# Patient Record
Sex: Male | Born: 1980 | Race: White | Hispanic: No
Health system: Southern US, Academic
[De-identification: ages and names within clinical notes are randomized; demographics above are authoritative.]

## PROBLEM LIST (undated history)

## (undated) ENCOUNTER — Ambulatory Visit

## (undated) ENCOUNTER — Ambulatory Visit
Payer: MEDICAID | Attending: Rehabilitative and Restorative Service Providers" | Primary: Rehabilitative and Restorative Service Providers"

## (undated) ENCOUNTER — Telehealth

## (undated) ENCOUNTER — Ambulatory Visit: Payer: MEDICAID

## (undated) ENCOUNTER — Encounter

## (undated) ENCOUNTER — Encounter: Attending: Critical Care Medicine | Primary: Critical Care Medicine

## (undated) ENCOUNTER — Encounter
Attending: Pharmacist Clinician (PhC)/ Clinical Pharmacy Specialist | Primary: Pharmacist Clinician (PhC)/ Clinical Pharmacy Specialist

## (undated) ENCOUNTER — Ambulatory Visit: Payer: PRIVATE HEALTH INSURANCE

## (undated) ENCOUNTER — Inpatient Hospital Stay

## (undated) ENCOUNTER — Ambulatory Visit: Attending: Neurological Surgery | Primary: Neurological Surgery

## (undated) ENCOUNTER — Encounter: Attending: Anesthesiology | Primary: Anesthesiology

## (undated) ENCOUNTER — Ambulatory Visit: Payer: MEDICAID | Attending: Radiation Oncology | Primary: Radiation Oncology

## (undated) ENCOUNTER — Telehealth
Attending: Student in an Organized Health Care Education/Training Program | Primary: Student in an Organized Health Care Education/Training Program

## (undated) ENCOUNTER — Encounter: Attending: Pharmacist | Primary: Pharmacist

## (undated) ENCOUNTER — Ambulatory Visit: Attending: Radiation Oncology | Primary: Radiation Oncology

## (undated) ENCOUNTER — Ambulatory Visit
Payer: MEDICAID | Attending: Student in an Organized Health Care Education/Training Program | Primary: Student in an Organized Health Care Education/Training Program

## (undated) ENCOUNTER — Telehealth: Attending: Radiation Oncology | Primary: Radiation Oncology

## (undated) ENCOUNTER — Encounter
Attending: Student in an Organized Health Care Education/Training Program | Primary: Student in an Organized Health Care Education/Training Program

## (undated) ENCOUNTER — Institutional Professional Consult (permissible substitution)
Payer: MEDICAID | Attending: Student in an Organized Health Care Education/Training Program | Primary: Student in an Organized Health Care Education/Training Program

## (undated) DIAGNOSIS — S143XXA Injury of brachial plexus, initial encounter: Secondary | ICD-10-CM

## (undated) HISTORY — PX: BRAIN SURGERY: SHX531

---

## 2011-07-23 ENCOUNTER — Ambulatory Visit: Payer: Self-pay | Admitting: Physical Medicine and Rehabilitation

## 2013-05-18 ENCOUNTER — Ambulatory Visit: Payer: Self-pay | Admitting: Gastroenterology

## 2014-06-08 DIAGNOSIS — E785 Hyperlipidemia, unspecified: Secondary | ICD-10-CM | POA: Insufficient documentation

## 2014-06-08 DIAGNOSIS — F419 Anxiety disorder, unspecified: Secondary | ICD-10-CM | POA: Insufficient documentation

## 2014-06-08 DIAGNOSIS — G8929 Other chronic pain: Secondary | ICD-10-CM | POA: Insufficient documentation

## 2014-07-26 DIAGNOSIS — M5412 Radiculopathy, cervical region: Secondary | ICD-10-CM | POA: Insufficient documentation

## 2014-07-26 DIAGNOSIS — M4802 Spinal stenosis, cervical region: Secondary | ICD-10-CM | POA: Insufficient documentation

## 2015-12-23 DIAGNOSIS — J0100 Acute maxillary sinusitis, unspecified: Secondary | ICD-10-CM | POA: Diagnosis not present

## 2017-12-30 ENCOUNTER — Encounter: Payer: Self-pay | Admitting: Neurosurgery

## 2017-12-30 ENCOUNTER — Emergency Department
Admission: EM | Admit: 2017-12-30 | Discharge: 2017-12-30 | Disposition: A | Discharge status: Home / Self Care | Payer: Self-pay | Attending: Emergency Medicine | Admitting: Emergency Medicine

## 2017-12-30 ENCOUNTER — Other Ambulatory Visit: Payer: Self-pay

## 2017-12-30 ENCOUNTER — Emergency Department: Payer: Self-pay

## 2017-12-30 DIAGNOSIS — G939 Disorder of brain, unspecified: Secondary | ICD-10-CM | POA: Insufficient documentation

## 2017-12-30 DIAGNOSIS — G9389 Other specified disorders of brain: Secondary | ICD-10-CM

## 2017-12-30 DIAGNOSIS — R569 Unspecified convulsions: Secondary | ICD-10-CM | POA: Insufficient documentation

## 2017-12-30 HISTORY — DX: Injury of brachial plexus, initial encounter: S14.3XXA

## 2017-12-30 LAB — URINE DRUG SCREEN, QUALITATIVE (ARMC ONLY)
AMPHETAMINES, UR SCREEN: NOT DETECTED
BARBITURATES, UR SCREEN: NOT DETECTED
BENZODIAZEPINE, UR SCRN: NOT DETECTED
CANNABINOID 50 NG, UR ~~LOC~~: POSITIVE — AB
Cocaine Metabolite,Ur ~~LOC~~: NOT DETECTED
MDMA (Ecstasy)Ur Screen: NOT DETECTED
Methadone Scn, Ur: NOT DETECTED
OPIATE, UR SCREEN: NOT DETECTED
PHENCYCLIDINE (PCP) UR S: NOT DETECTED
Tricyclic, Ur Screen: NOT DETECTED

## 2017-12-30 LAB — CBC WITH DIFFERENTIAL/PLATELET
Basophils Absolute: 0.1 K/uL (ref 0–0.1)
Basophils Relative: 1 %
Eosinophils Absolute: 0.1 K/uL (ref 0–0.7)
Eosinophils Relative: 1 %
HCT: 36.7 % — ABNORMAL LOW (ref 40.0–52.0)
Hemoglobin: 12.9 g/dL — ABNORMAL LOW (ref 13.0–18.0)
Lymphocytes Relative: 16 %
Lymphs Abs: 1.7 K/uL (ref 1.0–3.6)
MCH: 31.9 pg (ref 26.0–34.0)
MCHC: 35 g/dL (ref 32.0–36.0)
MCV: 91 fL (ref 80.0–100.0)
Monocytes Absolute: 0.6 K/uL (ref 0.2–1.0)
Monocytes Relative: 6 %
Neutro Abs: 8.5 K/uL — ABNORMAL HIGH (ref 1.4–6.5)
Neutrophils Relative %: 76 %
Platelets: 335 K/uL (ref 150–440)
RBC: 4.04 MIL/uL — ABNORMAL LOW (ref 4.40–5.90)
RDW: 12.7 % (ref 11.5–14.5)
WBC: 11 K/uL — ABNORMAL HIGH (ref 3.8–10.6)

## 2017-12-30 LAB — COMPREHENSIVE METABOLIC PANEL WITH GFR
ALT: 14 U/L — ABNORMAL LOW (ref 17–63)
AST: 23 U/L (ref 15–41)
Albumin: 4.7 g/dL (ref 3.5–5.0)
Alkaline Phosphatase: 52 U/L (ref 38–126)
Anion gap: 12 (ref 5–15)
BUN: 13 mg/dL (ref 6–20)
CO2: 20 mmol/L — ABNORMAL LOW (ref 22–32)
Calcium: 9.1 mg/dL (ref 8.9–10.3)
Chloride: 105 mmol/L (ref 101–111)
Creatinine, Ser: 1.03 mg/dL (ref 0.61–1.24)
GFR calc Af Amer: >60 mL/min
GFR calc non Af Amer: >60 mL/min
Glucose, Bld: 92 mg/dL (ref 65–99)
Potassium: 3.7 mmol/L (ref 3.5–5.1)
Sodium: 137 mmol/L (ref 135–145)
Total Bilirubin: 0.5 mg/dL (ref 0.3–1.2)
Total Protein: 7.7 g/dL (ref 6.5–8.1)

## 2017-12-30 MED ORDER — LEVETIRACETAM IN NACL 1000 MG/100ML IV SOLN
1000.0000 mg | Freq: Once | INTRAVENOUS | Status: AC
  Administered 2017-12-30: 1000 mg via INTRAVENOUS
  Filled 2017-12-30 (×2): qty 100

## 2017-12-30 MED ORDER — LEVETIRACETAM 500 MG PO TABS: 500.0000 mg | ORAL_TABLET | Freq: Two times a day (BID) | ORAL | 0 refills | Status: DC

## 2017-12-30 MED ORDER — SODIUM CHLORIDE 0.9 % IV BOLUS
1000.0000 mL | Freq: Once | INTRAVENOUS | Status: AC
  Administered 2017-12-30: 1000 mL via INTRAVENOUS

## 2017-12-30 MED ORDER — GADOBENATE DIMEGLUMINE 529 MG/ML IV SOLN
13.0000 mL | Freq: Once | INTRAVENOUS | Status: AC | PRN
  Administered 2017-12-30: 13 mL via INTRAVENOUS
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
## 2017-12-30 NOTE — ED Triage Notes (Signed)
Pt arrived via EMS from work after witnessed seizure.  Pt reportedly began seizing and fell to floor from chair.  Positive loss of bladder and vomiting.  EMS reports pt was postictal until placed on stretcher for transport

## 2017-12-30 NOTE — ED Provider Notes (Signed)
[facility] Emergency Department Provider Note  ___________________________________________   First MD Initiated Contact with Patient 12/30/17 1645     (approximate)  I have reviewed the triage vital signs and the nursing notes.   HISTORY  Chief Complaint Seizures   HPI Brian Parrish. is a 37 y.o. male without any chronic medical problems was presented to the emergency department today after first-time seizure.  He was at work sitting in a desk when bystanders witnessed him slumped over and then have generalized tonic-clonic activity for several minutes.  Patient urinated on himself and was postictal once EMS arrived.  Normal blood sugar for EMS.  Patient denies any complaints at this time other than being a bit shocked and surprised about the events earlier today.  He does not remember any prodrome.  Denies any recent headaches.  Says that he gets about 8 hours of sleep per night.  No excessive drinking.  No recreational drug use.  No family history of seizure disorder.   Past Medical History:  Diagnosis Date  . Brachial plexus injury, right     There are no active problems to display for this patient.     Prior to Admission medications   Not on File    Allergies Patient has no known allergies.  No family history on file.  Social History Social History   Tobacco Use  . Smoking status: Not on file  Substance Use Topics  . Alcohol use: Not on file  . Drug use: Not on file    Review of Systems  Constitutional: No fever/chills Eyes: No visual changes. ENT: No sore throat. Cardiovascular: Denies chest pain. Respiratory: Denies shortness of breath. Gastrointestinal: No abdominal pain.  No nausea, no vomiting.  No diarrhea.  No constipation. Genitourinary: Negative for dysuria. Musculoskeletal: Negative for back pain. Skin: Negative for rash. Neurological: Negative for headaches, focal weakness or  numbness.   ____________________________________________   PHYSICAL EXAM:  VITAL SIGNS: ED Triage Vitals  Enc Vitals Group     BP --      Pulse Rate 12/30/17 1639 91     Resp --      Temp 12/30/17 1639 98.4 F (36.9 C)     Temp Source 12/30/17 1639 Oral     SpO2 12/30/17 1635 100 %     Weight 12/30/17 1641 145 lb (65.8 kg)     Height 12/30/17 1641 5\' 8"  (1.727 m)     Head Circumference --      Peak Flow --      Pain Score 12/30/17 1640 0     Pain Loc --      Pain Edu? --      Excl. in [place]? --     Constitutional: Alert and oriented. Well appearing and in no acute distress. Eyes: Conjunctivae are normal.  Head: Atraumatic. Nose: No congestion/rhinnorhea. Mouth/Throat: Mucous membranes are moist.  Neck: No stridor.   Cardiovascular: Normal rate, regular rhythm. Grossly normal heart sounds.   Respiratory: Normal respiratory effort.  No retractions. Lungs CTAB. Gastrointestinal: Soft and nontender. No distention. Musculoskeletal: No lower extremity tenderness nor edema.  No joint effusions. Neurologic:  Normal speech and language. No gross focal neurologic deficits are appreciated. Skin:  Skin is warm, dry and intact. No rash noted. Psychiatric: Mood and affect are normal. Speech and behavior are normal.  ____________________________________________   LABS (all labs ordered are listed, but only abnormal results are displayed)  Labs Reviewed  CBC WITH DIFFERENTIAL/PLATELET -  Abnormal; Notable for the following components:      Result Value   WBC 11.0 (*)    RBC 4.04 (*)    Hemoglobin 12.9 (*)    HCT 36.7 (*)    Neutro Abs 8.5 (*)    All other components within normal limits  COMPREHENSIVE METABOLIC PANEL - Abnormal; Notable for the following components:   CO2 20 (*)    ALT 14 (*)    All other components within normal limits  URINE DRUG SCREEN, QUALITATIVE ([facility] ONLY) - Abnormal; Notable for the following components:   Cannabinoid 50 Ng, Ur [place] POSITIVE (*)    All  other components within normal limits  URINALYSIS, COMPLETE (UACMP) WITH MICROSCOPIC   ____________________________________________  EKG  ED ECG REPORT I, Doran Stabler, the attending physician, personally viewed and interpreted this ECG.   Date: 12/30/2017  EKG Time: 1630  Rate: 90  Rhythm: normal sinus rhythm  Axis: Normal  Intervals:none  ST&T Change: No ST segment elevation or depression.  No abnormal T wave inversion.  ____________________________________________  RADIOLOGY  Mass lesion in the right frontal region with surrounding white matter edema. ____________________________________________   PROCEDURES  Procedure(s) performed:   Procedures  Critical Care performed:   ____________________________________________   INITIAL IMPRESSION / ASSESSMENT AND PLAN / ED COURSE  Pertinent labs & imaging results that were available during my care of the patient were reviewed by me and considered in my medical decision making (see chart for details).  DDX: Epilepsy, less short abnormality, brain mass, withdrawal seizure, overdose As part of my medical decision making, I reviewed the following data within the [facility] to review outpatient records  ----------------------------------------- 5:20 PM on 12/30/2017 -----------------------------------------  Patient updated about finding of brain masses likely source of his seizure.  Discussed case Dr. Cari Caraway of neurosurgery who will be evaluating the patient.  ----------------------------------------- 8:35 PM on 12/30/2017 -----------------------------------------  Patient at this time still symptom-free.  MRI completed as requested by Dr. Cari Caraway.  Redemonstrated mass to the right frontal lobe.  Reviewed the imaging with the patient.  He is aware that he must not drive, ride a bike or go swimming or any other high risk activities that may cause injury if he seizes again.  He will be  discharged on 500 mg twice daily Keppra.  He will follow-up with Dr. Mcarthur Rossetti.  He is understanding the diagnosis as well as treatment plan willing to comply. ____________________________________________   FINAL CLINICAL IMPRESSION(S) / ED DIAGNOSES  Brain tumor.  Seizure.    NEW MEDICATIONS STARTED DURING THIS VISIT:  New Prescriptions   No medications on file     Note:  This document was prepared using Dragon voice recognition software and may include unintentional dictation errors.     Orbie Pyo, MD 12/30/17 2037

## 2018-01-17 ENCOUNTER — Ambulatory Visit: Admit: 2018-01-17 | Discharge: 2018-01-18 | Payer: MEDICAID

## 2018-01-17 DIAGNOSIS — G9389 Other specified disorders of brain: Principal | ICD-10-CM

## 2018-01-20 ENCOUNTER — Ambulatory Visit: Admit: 2018-01-20 | Discharge: 2018-01-21

## 2018-01-20 DIAGNOSIS — R900 Intracranial space-occupying lesion found on diagnostic imaging of central nervous system: Principal | ICD-10-CM

## 2018-03-30 MED ORDER — LEVETIRACETAM 500 MG TABLET
ORAL_TABLET | Freq: Two times a day (BID) | ORAL | 3 refills | 0 days | Status: CP
Start: 2018-03-30 — End: ?

## 2018-05-12 ENCOUNTER — Ambulatory Visit: Admit: 2018-05-12 | Discharge: 2018-05-12 | Payer: MEDICAID

## 2018-05-12 DIAGNOSIS — R900 Intracranial space-occupying lesion found on diagnostic imaging of central nervous system: Principal | ICD-10-CM

## 2018-05-12 DIAGNOSIS — Z01818 Encounter for other preprocedural examination: Principal | ICD-10-CM

## 2018-05-17 ENCOUNTER — Ambulatory Visit: Admit: 2018-05-17 | Discharge: 2018-05-18 | Payer: MEDICAID

## 2018-05-17 DIAGNOSIS — D499 Neoplasm of unspecified behavior of unspecified site: Principal | ICD-10-CM

## 2018-07-06 ENCOUNTER — Ambulatory Visit: Admit: 2018-07-06 | Discharge: 2018-07-06 | Payer: MEDICAID

## 2018-07-06 ENCOUNTER — Encounter: Admit: 2018-07-06 | Discharge: 2018-07-06 | Payer: MEDICAID

## 2018-07-06 DIAGNOSIS — C719 Malignant neoplasm of brain, unspecified: Principal | ICD-10-CM

## 2018-07-06 DIAGNOSIS — G939 Disorder of brain, unspecified: Principal | ICD-10-CM

## 2018-07-08 ENCOUNTER — Ambulatory Visit: Admit: 2018-07-08 | Discharge: 2018-07-10 | Disposition: A | Discharge status: Home / Self Care | Payer: MEDICAID

## 2018-07-08 ENCOUNTER — Encounter
Admit: 2018-07-08 | Discharge: 2018-07-10 | Disposition: A | Discharge status: Home / Self Care | Payer: MEDICAID | Attending: Certified Registered"

## 2018-07-08 DIAGNOSIS — G939 Disorder of brain, unspecified: Principal | ICD-10-CM

## 2018-07-09 DIAGNOSIS — G939 Disorder of brain, unspecified: Principal | ICD-10-CM

## 2018-07-10 MED ORDER — DEXAMETHASONE 1 MG TABLET
ORAL_TABLET | 0 refills | 0 days | Status: CP
Start: 2018-07-10 — End: ?

## 2018-07-10 MED ORDER — LEVETIRACETAM 1,000 MG TABLET
ORAL_TABLET | Freq: Two times a day (BID) | ORAL | 0 refills | 0 days | Status: CP
Start: 2018-07-10 — End: 2018-07-15

## 2018-07-10 MED ORDER — FAMOTIDINE 20 MG TABLET
ORAL_TABLET | Freq: Two times a day (BID) | ORAL | 0 refills | 0 days | Status: CP
Start: 2018-07-10 — End: 2018-08-09

## 2018-07-10 MED ORDER — OXYCODONE 10 MG TABLET
ORAL_TABLET | ORAL | 0 refills | 0 days | Status: CP | PRN
Start: 2018-07-10 — End: 2018-07-17

## 2018-07-22 ENCOUNTER — Ambulatory Visit: Admit: 2018-07-22 | Discharge: 2018-07-23 | Payer: MEDICAID

## 2018-07-22 DIAGNOSIS — Z4802 Encounter for removal of sutures: Principal | ICD-10-CM

## 2018-08-16 ENCOUNTER — Ambulatory Visit: Admit: 2018-08-16 | Discharge: 2018-08-16 | Attending: Radiation Oncology | Primary: Radiation Oncology

## 2018-08-16 ENCOUNTER — Ambulatory Visit
Admit: 2018-08-16 | Discharge: 2018-08-16 | Attending: Student in an Organized Health Care Education/Training Program | Primary: Student in an Organized Health Care Education/Training Program

## 2018-08-16 ENCOUNTER — Other Ambulatory Visit: Admit: 2018-08-16 | Discharge: 2018-08-16

## 2018-08-16 DIAGNOSIS — D496 Neoplasm of unspecified behavior of brain: Principal | ICD-10-CM

## 2018-08-16 DIAGNOSIS — G40909 Epilepsy, unspecified, not intractable, without status epilepticus: Secondary | ICD-10-CM

## 2018-08-16 DIAGNOSIS — C719 Malignant neoplasm of brain, unspecified: Secondary | ICD-10-CM

## 2018-08-16 DIAGNOSIS — F419 Anxiety disorder, unspecified: Secondary | ICD-10-CM

## 2018-08-17 DIAGNOSIS — G40909 Epilepsy, unspecified, not intractable, without status epilepticus: Secondary | ICD-10-CM | POA: Insufficient documentation

## 2018-10-06 ENCOUNTER — Ambulatory Visit: Admit: 2018-10-06 | Discharge: 2018-10-06

## 2018-10-06 DIAGNOSIS — C719 Malignant neoplasm of brain, unspecified: Principal | ICD-10-CM

## 2018-10-06 DIAGNOSIS — D496 Neoplasm of unspecified behavior of brain: Principal | ICD-10-CM

## 2018-10-24 DIAGNOSIS — R569 Unspecified convulsions: Principal | ICD-10-CM

## 2018-10-26 ENCOUNTER — Emergency Department: Payer: Self-pay

## 2018-10-26 ENCOUNTER — Emergency Department
Admission: EM | Admit: 2018-10-26 | Discharge: 2018-10-27 | Disposition: A | Discharge status: Home / Self Care | Payer: Self-pay | Attending: Emergency Medicine | Admitting: Emergency Medicine

## 2018-10-26 ENCOUNTER — Encounter: Payer: Self-pay | Admitting: Emergency Medicine

## 2018-10-26 ENCOUNTER — Other Ambulatory Visit: Payer: Self-pay

## 2018-10-26 DIAGNOSIS — F17290 Nicotine dependence, other tobacco product, uncomplicated: Secondary | ICD-10-CM | POA: Insufficient documentation

## 2018-10-26 DIAGNOSIS — R531 Weakness: Secondary | ICD-10-CM | POA: Insufficient documentation

## 2018-10-26 DIAGNOSIS — M545 Low back pain: Secondary | ICD-10-CM | POA: Insufficient documentation

## 2018-10-26 DIAGNOSIS — Z79899 Other long term (current) drug therapy: Secondary | ICD-10-CM | POA: Insufficient documentation

## 2018-10-26 DIAGNOSIS — M549 Dorsalgia, unspecified: Secondary | ICD-10-CM

## 2018-10-26 DIAGNOSIS — Z85841 Personal history of malignant neoplasm of brain: Secondary | ICD-10-CM | POA: Insufficient documentation

## 2018-10-26 DIAGNOSIS — M5416 Radiculopathy, lumbar region: Secondary | ICD-10-CM

## 2018-10-26 DIAGNOSIS — R202 Paresthesia of skin: Secondary | ICD-10-CM | POA: Insufficient documentation

## 2018-10-26 DIAGNOSIS — G8929 Other chronic pain: Secondary | ICD-10-CM | POA: Insufficient documentation

## 2018-10-26 LAB — BASIC METABOLIC PANEL
Anion gap: 9 (ref 5–15)
BUN: 9 mg/dL (ref 6–20)
CHLORIDE: 105 mmol/L (ref 98–111)
CO2: 26 mmol/L (ref 22–32)
Calcium: 9.3 mg/dL (ref 8.9–10.3)
Creatinine, Ser: 0.93 mg/dL (ref 0.61–1.24)
GFR calc non Af Amer: >60 mL/min (ref >60)
Glucose, Bld: 122 mg/dL — ABNORMAL HIGH (ref 70–99)
POTASSIUM: 3.5 mmol/L (ref 3.5–5.1)
SODIUM: 140 mmol/L (ref 135–145)

## 2018-10-26 LAB — CBC WITH DIFFERENTIAL/PLATELET
ABS IMMATURE GRANULOCYTES: 0.03 10*3/uL (ref 0.00–0.07)
Basophils Absolute: 0.1 10*3/uL (ref 0.0–0.1)
Basophils Relative: 1 %
EOS PCT: 3 %
Eosinophils Absolute: 0.3 10*3/uL (ref 0.0–0.5)
HEMATOCRIT: 38.7 % — AB (ref 39.0–52.0)
HEMOGLOBIN: 13.2 g/dL (ref 13.0–17.0)
Immature Granulocytes: 0 %
Lymphocytes Relative: 20 %
Lymphs Abs: 2 10*3/uL (ref 0.7–4.0)
MCH: 30.6 pg (ref 26.0–34.0)
MCHC: 34.1 g/dL (ref 30.0–36.0)
MCV: 89.8 fL (ref 80.0–100.0)
MONO ABS: 0.6 10*3/uL (ref 0.1–1.0)
MONOS PCT: 6 %
Neutro Abs: 7 10*3/uL (ref 1.7–7.7)
Neutrophils Relative %: 70 %
Platelets: 341 10*3/uL (ref 150–400)
RBC: 4.31 MIL/uL (ref 4.22–5.81)
RDW: 12.2 % (ref 11.5–15.5)
WBC: 10 10*3/uL (ref 4.0–10.5)
nRBC: 0 % (ref 0.0–0.2)

## 2018-10-26 MED ORDER — ONDANSETRON HCL 4 MG/2ML IJ SOLN
4.0000 mg | Freq: Once | INTRAMUSCULAR | Status: AC
  Administered 2018-10-26: 4 mg via INTRAVENOUS
  Filled 2018-10-26: qty 2

## 2018-10-26 MED ORDER — MORPHINE SULFATE (PF) 4 MG/ML IV SOLN
4.0000 mg | Freq: Once | INTRAVENOUS | Status: AC
Start: 2018-10-26 — End: 2018-10-26
  Administered 2018-10-26: 4 mg via INTRAVENOUS
  Filled 2018-10-26: qty 1

## 2018-10-26 NOTE — ED Provider Notes (Signed)
[facility] Emergency Department Provider Note  Time seen: 11:11 PM  I have reviewed the triage vital signs and the nursing notes.   HISTORY  Chief Complaint Back Pain and Weakness    HPI Brian Parrish. is a 38 y.o. 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  According to the patient for the past 10 days he has been experiencing progressively worsening lower back pain, worse with movement or palpation.  States he is also been experiencing numbness in both of his legs as well as a tingling sensation in both of his upper extremities.  Patient had a grade 2 astrocytoma resected in November 2019, has been following up with [facility] oncology since the resection, plan to start radiation in the future.  Patient states he last had an MRI of the brain in February which showed no significant progression of the remaining part of the astrocytoma.  Patient states he has never had any back pain numbness or tingling in the past.  Denies any fever.   Past Medical History:  Diagnosis Date  . Brachial plexus injury, right     There are no active problems to display for this patient.   History reviewed. No pertinent surgical history.  Prior to Admission medications   Medication Sig Start Date End Date Taking? Authorizing Provider  levETIRAcetam (KEPPRA) 500 MG tablet Take 1 tablet (500 mg total) by mouth 2 (two) times daily. 12/30/17   Schaevitz, Randall An, MD    No Known Allergies  History reviewed. No pertinent family history.  Social History Social History   Tobacco Use  . Smoking status: Current Every Day Smoker    Types: Cigars  . Smokeless tobacco: Never Used  Substance Use Topics  . Alcohol use: Not on file  . Drug use: Not on file    Review of Systems Constitutional: Negative for  fever. Cardiovascular: Negative for chest pain. Respiratory: Negative for shortness of breath. Gastrointestinal: Negative for abdominal pain Genitourinary: Negative for urinary compaints Musculoskeletal: Negative for musculoskeletal complaints Skin: Negative for skin complaints  Neurological: Negative for headache All other ROS negative  ____________________________________________   PHYSICAL EXAM:  VITAL SIGNS: ED Triage Vitals  Enc Vitals Group     BP 10/26/18 1737 121/82     Pulse Rate 10/26/18 1737 100     Resp 10/26/18 1737 16     Temp 10/26/18 1737 98.7 F (37.1 C)     Temp Source 10/26/18 1737 Oral     SpO2 10/26/18 1737 100 %     Weight 10/26/18 1752 150 lb (68 kg)     Height 10/26/18 1752 5\' 8"  (1.727 m)     Head Circumference --      Peak Flow --      Pain Score 10/26/18 1738 10     Pain Loc --      Pain Edu? --      Excl. in [place]? --    Constitutional: Alert and oriented. Well appearing and in no distress. Eyes: Normal exam ENT   Head: Normocephalic and atraumatic.   Mouth/Throat: Mucous membranes are moist. Cardiovascular: Normal rate, regular rhythm.  Respiratory: Normal respiratory effort without tachypnea nor retractions. Breath sounds are clear  Gastrointestinal: Soft and nontender. No distention.  Musculoskeletal: Nontender with normal range of motion in all extremities. Neurologic:  Normal speech and  language.  Patient has good strength in all extremities however he describes decreased sensation right greater than left in his lower extremities as well as a tingling sensation in the upper extremities.  Renal nerves intact. Skin:  Skin is warm, dry and intact.  Psychiatric: Mood and affect are normal.   ____________________________________________   RADIOLOGY  MRI is pending  ____________________________________________   INITIAL IMPRESSION / ASSESSMENT AND PLAN / ED COURSE  Pertinent labs & imaging results that were available during my  care of the patient were reviewed by me and considered in my medical decision making (see chart for details).  Patient presents to the emergency department for numbness and tingling of the upper and lower extremities along with lower back pain worse with movement.  Differential would include radicular pain, sciatica, lower back pain or lumbar strain.  Differential however would also include metastatic spread of tumor, or recurrence of tumor.  Given the patient's neurological findings with history of grade 2 astrocytoma we will proceed with MR imaging of the brain, C-spine, T-spine and L-spine.  Patient agreeable to plan of care.  Patient care signed out to Dr. Owens Shark.  ____________________________________________   FINAL CLINICAL IMPRESSION(S) / ED DIAGNOSES  Back pain Paresthesias   Harvest Dark, MD 10/26/18 2315

## 2018-10-26 NOTE — ED Triage Notes (Signed)
Pt presents to ED c/o low back pain x10 days radiating down both legs with worsening bil lower extremity weakness and numbness. Hx brain surgery for cancer in November 2019. No incontinence.

## 2018-10-26 NOTE — ED Notes (Signed)
Pt stated he could not urinate at this time. Will attempt to collect specimen at a later time

## 2018-10-26 NOTE — ED Notes (Addendum)
Spoke to [employer] regarding pt presentation. Orders received for basic labs, UA, and lumbar spine XR. First nurse notified pt needs protocols done.

## 2018-10-27 ENCOUNTER — Emergency Department: Payer: Self-pay

## 2018-10-27 MED ORDER — HYDROMORPHONE HCL 1 MG/ML IJ SOLN
INTRAMUSCULAR | Status: AC
  Administered 2018-10-27: 1 mg via INTRAVENOUS
  Filled 2018-10-27: qty 1

## 2018-10-27 MED ORDER — GADOBUTROL 1 MMOL/ML IV SOLN
6.0000 mL | Freq: Once | INTRAVENOUS | Status: AC | PRN
  Administered 2018-10-27: 6 mL via INTRAVENOUS

## 2018-10-27 MED ORDER — LIDOCAINE 5 % EX PTCH: 1.0000 | MEDICATED_PATCH | CUTANEOUS | 0 refills | Status: DC

## 2018-10-27 MED ORDER — PREDNISONE 20 MG PO TABS
60.0000 mg | ORAL_TABLET | Freq: Once | ORAL | Status: AC
  Administered 2018-10-27: 60 mg via ORAL
  Filled 2018-10-27: qty 3

## 2018-10-27 MED ORDER — HYDROMORPHONE HCL 1 MG/ML IJ SOLN
1.0000 mg | Freq: Once | INTRAMUSCULAR | Status: AC
  Administered 2018-10-27: 1 mg via INTRAVENOUS

## 2018-10-27 MED ORDER — CYCLOBENZAPRINE HCL 10 MG PO TABS
10.0000 mg | ORAL_TABLET | Freq: Once | ORAL | Status: AC
  Administered 2018-10-27: 10 mg via ORAL
  Filled 2018-10-27: qty 1

## 2018-10-27 MED ORDER — PREDNISONE 20 MG PO TABS: 60.0000 mg | ORAL_TABLET | Freq: Every day | ORAL | 0 refills | Status: AC

## 2018-10-27 MED ORDER — LIDOCAINE 5 % EX PTCH
1.0000 | MEDICATED_PATCH | CUTANEOUS | Status: DC
  Administered 2018-10-27: 1 via TRANSDERMAL
  Filled 2018-10-27: qty 1

## 2018-10-27 MED ORDER — CYCLOBENZAPRINE HCL 10 MG PO TABS: 10.0000 mg | ORAL_TABLET | Freq: Three times a day (TID) | ORAL | 0 refills | Status: DC | PRN

## 2018-10-27 NOTE — ED Notes (Signed)
MD at bedside. 

## 2018-10-27 NOTE — ED Notes (Signed)
Patient transported to MRI 

## 2019-05-24 ENCOUNTER — Telehealth: Admit: 2019-05-24 | Discharge: 2019-05-25 | Payer: MEDICAID | Attending: Neurology | Primary: Neurology

## 2019-05-24 DIAGNOSIS — C719 Malignant neoplasm of brain, unspecified: Secondary | ICD-10-CM

## 2019-05-24 DIAGNOSIS — R569 Unspecified convulsions: Secondary | ICD-10-CM

## 2019-05-24 MED ORDER — LEVETIRACETAM 500 MG TABLET
ORAL_TABLET | Freq: Two times a day (BID) | ORAL | 3 refills | 90.00000 days | Status: CP
Start: 2019-05-24 — End: ?

## 2019-07-10 ENCOUNTER — Institutional Professional Consult (permissible substitution)
Admit: 2019-07-10 | Discharge: 2019-07-11 | Payer: MEDICAID | Attending: Student in an Organized Health Care Education/Training Program | Primary: Student in an Organized Health Care Education/Training Program

## 2019-07-10 DIAGNOSIS — C719 Malignant neoplasm of brain, unspecified: Principal | ICD-10-CM

## 2021-09-17 ENCOUNTER — Emergency Department: Payer: No Typology Code available for payment source

## 2021-09-17 ENCOUNTER — Encounter: Payer: Self-pay | Admitting: Emergency Medicine

## 2021-09-17 ENCOUNTER — Emergency Department
Admission: EM | Admit: 2021-09-17 | Discharge: 2021-09-17 | Disposition: A | Discharge status: Other Acute Inpatient Hospital | Payer: No Typology Code available for payment source | Attending: Emergency Medicine | Admitting: Emergency Medicine

## 2021-09-17 ENCOUNTER — Other Ambulatory Visit: Payer: Self-pay

## 2021-09-17 ENCOUNTER — Encounter
Admit: 2021-09-17 | Discharge: 2021-09-19 | Disposition: A | Discharge status: Home / Self Care | Payer: MEDICAID | Source: Other Acute Inpatient Hospital

## 2021-09-17 ENCOUNTER — Ambulatory Visit
Admit: 2021-09-17 | Discharge: 2021-09-19 | Disposition: A | Discharge status: Home / Self Care | Payer: MEDICAID | Source: Other Acute Inpatient Hospital

## 2021-09-17 ENCOUNTER — Encounter
Admit: 2021-09-17 | Discharge: 2021-09-19 | Disposition: A | Discharge status: Home / Self Care | Payer: MEDICAID | Source: Other Acute Inpatient Hospital | Attending: Student in an Organized Health Care Education/Training Program

## 2021-09-17 ENCOUNTER — Ambulatory Visit: Admit: 2021-09-17 | Payer: MEDICAID

## 2021-09-17 ENCOUNTER — Ambulatory Visit
Admit: 2021-09-17 | Discharge: 2021-10-08 | Payer: MEDICAID | Attending: Radiation Oncology | Primary: Radiation Oncology

## 2021-09-17 ENCOUNTER — Ambulatory Visit: Admit: 2021-09-17 | Payer: MEDICAID | Attending: Radiation Oncology | Primary: Radiation Oncology

## 2021-09-17 DIAGNOSIS — Z8616 Personal history of COVID-19: Secondary | ICD-10-CM | POA: Insufficient documentation

## 2021-09-17 DIAGNOSIS — G936 Cerebral edema: Secondary | ICD-10-CM

## 2021-09-17 DIAGNOSIS — Z20822 Contact with and (suspected) exposure to covid-19: Secondary | ICD-10-CM | POA: Insufficient documentation

## 2021-09-17 DIAGNOSIS — C719 Malignant neoplasm of brain, unspecified: Secondary | ICD-10-CM | POA: Diagnosis not present

## 2021-09-17 DIAGNOSIS — Z041 Encounter for examination and observation following transport accident: Secondary | ICD-10-CM | POA: Insufficient documentation

## 2021-09-17 DIAGNOSIS — D496 Neoplasm of unspecified behavior of brain: Secondary | ICD-10-CM

## 2021-09-17 DIAGNOSIS — R410 Disorientation, unspecified: Secondary | ICD-10-CM | POA: Diagnosis present

## 2021-09-17 LAB — BASIC METABOLIC PANEL
Anion gap: 8 (ref 5–15)
BUN: 16 mg/dL (ref 6–20)
CO2: 26 mmol/L (ref 22–32)
Calcium: 9.1 mg/dL (ref 8.9–10.3)
Chloride: 104 mmol/L (ref 98–111)
Creatinine, Ser: 0.93 mg/dL (ref 0.61–1.24)
GFR, Estimated: >60 mL/min (ref >60)
Glucose, Bld: 106 mg/dL — ABNORMAL HIGH (ref 70–99)
Potassium: 3.2 mmol/L — ABNORMAL LOW (ref 3.5–5.1)
Sodium: 138 mmol/L (ref 135–145)

## 2021-09-17 LAB — CBC WITH DIFFERENTIAL/PLATELET
Abs Immature Granulocytes: 0.05 10*3/uL (ref 0.00–0.07)
Basophils Absolute: 0.1 10*3/uL (ref 0.0–0.1)
Basophils Relative: 1 %
Eosinophils Absolute: 0.1 10*3/uL (ref 0.0–0.5)
Eosinophils Relative: 1 %
HCT: 43 % (ref 39.0–52.0)
Hemoglobin: 14.3 g/dL (ref 13.0–17.0)
Immature Granulocytes: 0 %
Lymphocytes Relative: 21 %
Lymphs Abs: 2.7 10*3/uL (ref 0.7–4.0)
MCH: 30.5 pg (ref 26.0–34.0)
MCHC: 33.3 g/dL (ref 30.0–36.0)
MCV: 91.7 fL (ref 80.0–100.0)
Monocytes Absolute: 1.1 10*3/uL — ABNORMAL HIGH (ref 0.1–1.0)
Monocytes Relative: 8 %
Neutro Abs: 8.8 10*3/uL — ABNORMAL HIGH (ref 1.7–7.7)
Neutrophils Relative %: 69 %
Platelets: 335 10*3/uL (ref 150–400)
RBC: 4.69 MIL/uL (ref 4.22–5.81)
RDW: 12.2 % (ref 11.5–15.5)
WBC: 12.7 10*3/uL — ABNORMAL HIGH (ref 4.0–10.5)
nRBC: 0 % (ref 0.0–0.2)

## 2021-09-17 LAB — RESP PANEL BY RT-PCR (FLU A&B, COVID) ARPGX2
Influenza A by PCR: NEGATIVE
Influenza B by PCR: NEGATIVE
SARS Coronavirus 2 by RT PCR: NEGATIVE

## 2021-09-17 LAB — ETHANOL: Alcohol, Ethyl (B): <10 mg/dL (ref <10)

## 2021-09-17 LAB — LACTIC ACID, PLASMA: Lactic Acid, Venous: 0.9 mmol/L (ref 0.5–1.9)

## 2021-09-17 NOTE — ED Notes (Signed)
Pt transported by [facility] to [facility]
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
## 2021-09-19 DIAGNOSIS — C719 Malignant neoplasm of brain, unspecified: Principal | ICD-10-CM

## 2021-09-19 DIAGNOSIS — D496 Neoplasm of unspecified behavior of brain: Principal | ICD-10-CM

## 2021-09-19 MED ORDER — BLOOD-GLUCOSE METER KIT WRAPPER
0 refills | 0 days | Status: CP
Start: 2021-09-19 — End: 2021-09-19
  Filled 2021-09-19: qty 100, 25d supply, fill #0

## 2021-09-19 MED ORDER — LANCETS 30 GAUGE
0 refills | 0 days | Status: CP
Start: 2021-09-19 — End: ?

## 2021-09-19 MED ORDER — BLOOD-GLUCOSE METER
0 refills | 0 days | Status: CP
Start: 2021-09-19 — End: ?

## 2021-09-19 MED ORDER — LANCING DEVICE
0 refills | 0.00000 days | Status: CP
Start: 2021-09-19 — End: 2021-09-19

## 2021-09-19 MED ORDER — INSULIN ASPART (U-100) 100 UNIT/ML (3 ML) SUBCUTANEOUS PEN
0 refills | 0 days | Status: CP
Start: 2021-09-19 — End: ?
  Filled 2021-09-19: qty 15, 75d supply, fill #0

## 2021-09-19 MED ORDER — BLOOD GLUCOSE TEST STRIPS
ORAL_STRIP | 0 refills | 0 days | Status: CP
Start: 2021-09-19 — End: 2021-09-19

## 2021-09-19 MED ORDER — PEN NEEDLE, DIABETIC 32 GAUGE X 5/32" (4 MM)
0 refills | 0.00000 days | Status: CP
Start: 2021-09-19 — End: 2021-09-19

## 2021-09-19 MED ORDER — OXYCODONE 5 MG TABLET
ORAL_TABLET | ORAL | 0 refills | 4.00000 days | Status: CP | PRN
Start: 2021-09-19 — End: 2021-09-19
  Filled 2021-09-19: qty 20, 3d supply, fill #0

## 2021-09-19 MED ORDER — INSULIN LISPRO (U-100) 100 UNIT/ML SUBCUTANEOUS PEN
0 refills | 0.00000 days | Status: CP
Start: 2021-09-19 — End: 2021-09-19

## 2021-09-19 MED ORDER — LANCETS
0 refills | 0 days | Status: CP
Start: 2021-09-19 — End: 2021-09-19
  Filled 2021-09-19: qty 100, 20d supply, fill #0

## 2021-09-19 MED ORDER — DEXAMETHASONE 1 MG TABLET
ORAL_TABLET | ORAL | 0 refills | 42.00000 days | Status: CP
Start: 2021-09-19 — End: 2021-10-31
  Filled 2021-09-19: qty 272, 14d supply, fill #0

## 2021-09-19 MED ORDER — FAMOTIDINE 20 MG TABLET
ORAL_TABLET | Freq: Two times a day (BID) | ORAL | 0 refills | 30.00000 days | Status: CP
Start: 2021-09-19 — End: 2021-10-19
  Filled 2021-09-19: qty 28, 14d supply, fill #0

## 2021-09-19 MED ORDER — LEVETIRACETAM 750 MG TABLET
ORAL_TABLET | Freq: Two times a day (BID) | ORAL | 0 refills | 30 days | Status: CP
Start: 2021-09-19 — End: 2021-10-19

## 2021-09-19 MED ORDER — ON CALL EXPRESS TEST STRIP
0 refills | 0 days | Status: CP
Start: 2021-09-19 — End: ?
  Filled 2021-09-19: qty 100, 20d supply, fill #0

## 2021-09-19 MED FILL — ON CALL LANCING DEVICE: 30 days supply | Qty: 1 | Fill #0

## 2021-09-19 MED FILL — ON CALL EXPRESS METER: 30 days supply | Qty: 1 | Fill #0

## 2021-09-20 MED ORDER — CEPHALEXIN 500 MG CAPSULE
ORAL_CAPSULE | Freq: Two times a day (BID) | ORAL | 0 refills | 5.00000 days | Status: CP
Start: 2021-09-20 — End: 2021-09-19
  Filled 2021-09-19: qty 10, 5d supply, fill #0

## 2021-09-20 MED ORDER — LEVETIRACETAM 750 MG TABLET
ORAL_TABLET | Freq: Two times a day (BID) | ORAL | 1 refills | 30 days | Status: CP
Start: 2021-09-20 — End: 2021-11-19

## 2021-09-22 DIAGNOSIS — D496 Neoplasm of unspecified behavior of brain: Principal | ICD-10-CM

## 2021-10-02 ENCOUNTER — Ambulatory Visit: Admit: 2021-10-02

## 2021-10-02 ENCOUNTER — Ambulatory Visit: Admit: 2021-09-17 | Payer: MEDICAID

## 2021-10-07 ENCOUNTER — Ambulatory Visit: Admit: 2021-10-07 | Discharge: 2021-10-08

## 2021-10-07 DIAGNOSIS — C719 Malignant neoplasm of brain, unspecified: Principal | ICD-10-CM

## 2021-10-07 MED ORDER — ONDANSETRON HCL 8 MG TABLET
ORAL_TABLET | 2 refills | 0 days | Status: CP
Start: 2021-10-07 — End: ?
  Filled 2021-10-24: qty 60, 20d supply, fill #0

## 2021-10-07 MED ORDER — TEMOZOLOMIDE 140 MG CAPSULE
ORAL_CAPSULE | Freq: Every day | ORAL | 0 refills | 42 days | Status: CP
Start: 2021-10-07 — End: 2021-11-18
  Filled 2021-10-24: qty 30, 30d supply, fill #0
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
## 2021-10-08 DIAGNOSIS — C719 Malignant neoplasm of brain, unspecified: Principal | ICD-10-CM

## 2021-10-08 NOTE — Unmapped (Signed)
Called patient re: [employer] Financial [employer] Application (2/22, 15:45); no answer. VM was not set up, will try again later.    Called again (3/2, 15:55), no answer, unable to leave vm.

## 2021-10-15 ENCOUNTER — Ambulatory Visit: Admit: 2021-10-15 | Discharge: 2021-11-14 | Attending: Radiation Oncology | Primary: Radiation Oncology

## 2021-10-15 ENCOUNTER — Ambulatory Visit: Admit: 2021-10-15 | Discharge: 2021-11-04 | Attending: Radiation Oncology | Primary: Radiation Oncology

## 2021-10-15 ENCOUNTER — Ambulatory Visit: Admit: 2021-10-15 | Attending: Radiation Oncology | Primary: Radiation Oncology

## 2021-10-15 ENCOUNTER — Ambulatory Visit: Admit: 2021-10-15 | Discharge: 2021-10-28 | Attending: Radiation Oncology | Primary: Radiation Oncology

## 2021-10-15 ENCOUNTER — Ambulatory Visit: Admit: 2021-10-15 | Payer: MEDICAID

## 2021-10-15 ENCOUNTER — Ambulatory Visit: Admit: 2021-10-15 | Discharge: 2021-10-24 | Attending: Radiation Oncology | Primary: Radiation Oncology

## 2021-10-15 DIAGNOSIS — C719 Malignant neoplasm of brain, unspecified: Principal | ICD-10-CM

## 2021-10-15 MED ORDER — SULFAMETHOXAZOLE 800 MG-TRIMETHOPRIM 160 MG TABLET
ORAL_TABLET | ORAL | 0 refills | 42 days | Status: CP
Start: 2021-10-15 — End: ?

## 2021-10-22 ENCOUNTER — Ambulatory Visit: Admit: 2021-10-15 | Payer: MEDICAID

## 2021-10-22 ENCOUNTER — Ambulatory Visit: Admit: 2021-10-16 | Attending: Radiation Oncology | Primary: Radiation Oncology

## 2021-10-22 ENCOUNTER — Ambulatory Visit: Admit: 2021-10-16 | Payer: MEDICAID | Attending: Radiation Oncology | Primary: Radiation Oncology

## 2021-10-24 ENCOUNTER — Ambulatory Visit: Admit: 2021-10-24 | Discharge: 2021-10-25

## 2021-10-24 NOTE — Unmapped (Signed)
[facility] Specialty Medication Onboarding    Specialty Medication: Temozolomide 140mg   Prior Authorization: Not Required   Financial Assistance: No - copay  <$25  Final Copay/Day Supply: $0 / 30    Insurance Restrictions: None     Notes to Pharmacist: Billed to temp [employer]. Patient must finish application to fill next month.    The triage team has completed the benefits investigation and has determined that the patient is able to fill this medication at [facility]. Please contact the patient to complete the onboarding or follow up with the prescribing physician as needed.

## 2021-10-24 NOTE — Unmapped (Signed)
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  I am speaking to the patient.    Was a Nurse, learning disability used for this call? No    Verified patient's date of birth / HIPAA.    Specialty medication(s) to be sent: Hematology/Oncology: Temozolomide 140mg     Non-specialty medications/supplies to be sent: ondansetron    Medications not needed at this time: none     Temodar (temozolomide)    Medication & Administration     Dosage: Take 1 capsule (140 mg total) by mouth daily. Take one hour before radiation, and one hour after nausea medication.    Administration:   ??? Take without food at bedtime.  o   At least 1 hour before our two hours after meals  ??? Take with a full glass of water  ??? Swallow capsules whole, do not open or chew.  ??? Anti-emetics are recommended to prevent nausea and vomiting. Take prescribed anti-emetic 30 minutes prior to scheduled dose.  If you throw up after taking this drug, do not repeat the dose.    ??? Remember to take the medication as prescribed.  Your dose may be made up of 2 or more different strengths and colors of capsules.       Adherence/Missed dose instructions:   ??? If you miss a dose, contact your prescriber immediately for instructions for further instructions.  ??? If you throw up after taking this drug, do not repeat the dose    Goals of Therapy     ??? To prevent disease progression  ??? To treat brain cancer    Side Effects & Monitoring Parameters     Commonly reported side effects  ??? Fatigue  ??? Nausea, vomiting, constipation, diarrhea, stomach pain/upset stomach (Usually antiemetic prescribed for nausea, can take stool softener/Miralax for constipation, Imodium AD/loperamide for diarrhea)  ??? Abdominal pain  ??? Change in taste, loss of appetite  ??? Decreased red blood cell count (anemia) - feeling dizzy, sleepy, tired, or weak  ??? Weight gain  ??? Back pain, muscle pain, joint pain  ??? Difficulty seeping  ??? Common cold symptoms  ??? Hair loss  ??? Dry skin  ??? Headache  ??? Mouth irritation or mouth sores (may use baking soda or salt water rinses 3-4 times daily)    The following side effects should be reported to the provider:  ??? Signs of infection (fever >100.4, chills, mouth sores, sputum production)  ??? Unexplained bruising or bleeding (vomiting or coughing up blood, blood that looks like coffee grounds, blood in the urine or black, red tarry stools, bruising that gets bigger without reason, any persistent or severe bleeding, impaired wound healing)  ??? Signs of liver problems (dark urine, abdominal pain, light-colored stools, vomiting, yellow skin or eyes, not hungry)  ??? Any abdominal burning, numbness, or tingling feeling  ??? Signs of cerebrovascular disease (change in strength on one side is greater than the other, trouble speaking or thinking, change in balance, drooping on one side of the face, or vision changes)  ??? Shortness of breath, excess weight gain, swelling in arms or legs  ??? Confusion, mood changes, trouble with memory, seizures  ??? Difficulty controlling bladder  ??? Burning or numbness feeling  ??? Severe headache  ??? Vision changes  ??? Difficulty swallowing  ??? Severe loss of energy and strength  ??? Pale skin, pinpoint red spots  on skin  ??? Signs of allergic reaction / anaphylaxis (wheezing, chest tightness, swelling of face, lips, tongue or throat)  ??? Rash (physician can prescribe prednisone to alleviate)  ??? Breast pain    Monitoring Parameters:   ??? CBC with differential and platelets at baseline and during treatment  ??? Liver function at baseline and during treatment  ??? Evaluate pregnancy status prior to use in females of reproductive potential  ??? Monitor for lymphopenia and for signs/symptoms of Pneumocystis jirovecii pneumonia  ??? Monitor adherence    Contraindications, Warnings, & Precautions     Contraindications:  ??? Hypersensitivity to temozolomide or any component of the formulation  ??? Hypersensitivity to dacarbazine     Warnings & Precautions:  ??? Bone marrow suppression: Myelosuppression some with fatal outcomes, may occur. Hematologic toxicity may require treatment interruption, dose reduction, and/or discontinuation.   ??? GI toxicity: Temozolomide is associated with a moderate emetic potential; antiemetics may be recommended to prevent nausea and vomiting.  ??? Hepatotoxicity: Hepatotoxicity has been reported; may be severe or fatal. Monitor liver function tests at baseline, halfway through the first cycle, prior to each subsequent cycle, and at ~2 to 4 weeks after the last temozolomide dose.  ??? Hypersensitivity: Allergic reactions (including anaphylaxis) have been observed with temozolomide.  ??? Pneumocystis pneumonia: Pneumocystis jirovecii pneumonia (PCP) may occur in patients receiving temozolomide; risk is increased in those receiving corticosteroids or with longer temozolomide treatment regimens. Monitor all patients for development of lymphopenia and PCP.   ??? Secondary malignancies: Cases of myelodysplastic syndromes and secondary malignancies, including myeloid leukemia, have been reported following treatment with temozolomide.  ??? Avoid sun exposure  ??? Hepatic impairment: Use with caution in patients with severe hepatic impairment.  ??? Renal impairment: Use with caution in patients with severe renal impairment; has not been studied in dialysis patients.  ??? Elderly: Patients ?39 years of age experienced a higher incidence of grade 4 neutropenia and thrombocytopenia in cycle 1 (compared to younger patients).  ??? Polysorbate 80: Some dosage forms may contain polysorbate 80 (also known as Tweens). Hypersensitivity reactions, usually a delayed reaction, have been reported following exposure to pharmaceutical products containing polysorbate 80 in certain individuals  ??? Temozolomide resistance: Increased MGMT activity/levels within tumor tissue is associated with temozolomide resistance.   ??? Reproductive Considerations: Evaluate pregnancy status prior to use in females of reproductive potential. Females of reproductive potential should use effective contraception during treatment and for at least 6 months after the last temozolomide dose. Males with pregnant partners or with male partners of reproductive potential should use condoms during treatment and for at least 3 months after the last temozolomide dose. Males should not donate semen during treatment and for at least 3 months after the last temozolomide dose.  Temozolomide may impair fertility; limited data indicate changes in sperm parameters during temozolomide treatment; however, there is no information in duration or reversibility of sperm changes.  Based on the mechanism of action and findings in animal reproduction studies, in utero exposure to temozolomide may cause fetal harm.  ??? Breastfeeding Considerations: It is not known if temozolomide is present in breast milk. Due to the potential for serious adverse reactions (including myelosuppression) in the breastfed infant, breastfeeding is not recommended by the manufacturer during treatment and for at least 1 week after the last temozolomide dose    Drug/Food Interactions     ??? Medication list reviewed in Epic. The patient was instructed to inform the care team before taking any new medications or  supplements. No drug interactions identified.   ??? Do not receive immunizations/vaccinations without contacting doctor, including flu shot.  ??? Food reduces the rate and extent of absorption.    Storage, Handling Precautions, & Disposal     ??? Store at room temperature in the original container in a dry place (do not use a pillbox or store with other medications).   ??? Caregivers helping administer medication should wear gloves and wash hands immediately after.    ??? Keep the lid tightly closed. Keep out of the reach of children and pets.  ??? If the capsule I opened or broken, do not touch the contents. If the contents are touched or they get in the eyes, wash hands or eyes immediately.    ??? Do not flush down a toilet or pour down a drain unless instructed to do so.  Check with your local police department or fire station about drug take-back programs in your area.  ??? This drug is considered hazardous and should be handled as little as possible.    Current Medications (including OTC/herbals), Comorbidities and Allergies     Current Outpatient Medications   Medication Sig Dispense Refill   ??? blood sugar diagnostic (ON CALL EXPRESS TEST STRIP) Strp Use to check blood sugar as directed with insulin 3 times a day & for symptoms of high or low blood sugar. 100 each 0   ??? blood-glucose meter Misc Use as instructed. 1 each 0   ??? cyclobenzaprine (FLEXERIL) 10 MG tablet Take 10 mg by mouth. (Patient not taking: Reported on 10/07/2021)     ??? dexAMETHasone (DECADRON) 1 MG tablet Take 10 tablets by mouth every 6 hours 2 days, 8 tablets every 6 hours  2 days, 6 tablets every 6 hours 2 days, 4 tablets every 6 hours 2 days, 4 tablets every 8 hours 2 days, 4 tablets 2 times a day 2 days then 2 tablets 2 times a day. 384 tablet 0   ??? famotidine (PEPCID) 20 MG tablet Take 1 tablet (20 mg total) by mouth Two (2) times a day. 60 tablet 0   ??? insulin ASPART (NOVOLOG FLEXPEN U-100 INSULIN) 100 unit/mL (3 mL) injection pen Please check your blood glucose levels before every meal and at bedtime and correct according to this scale. Thank you. Correction insulin scale based on Insulin sensitivity factor 40:BG 160-200 = 1 unit BG 201-240 = 2 units BG 241-280 = 3 units BG 281-300 = 4 units BG > 300 = 5 units 15 mL 0   ??? lancets 30 gauge Misc Use to check blood sugar as directed with insulin 3 times a day & for symptoms of high or low blood sugar. 100 each 0   ??? lancing device Misc use as directed 1 each 0   ??? levETIRAcetam (KEPPRA) 750 MG tablet Take 1 tablet (750 mg total) by mouth Two (2) times a day. 60 tablet 1   ??? lidocaine (LIDODERM) 5 % patch Place 1 patch on the skin. (Patient not taking: Reported on 10/07/2021)     ??? ondansetron (ZOFRAN) 8 MG tablet Take one hour before chemotherapy and every 8 hours as needed for nausea 60 tablet 2   ??? pen needle, diabetic 32 gauge x 5/32 (4 mm) Ndle Use with insulin up to 4 times/day as needed. 100 each 0   ??? sulfamethoxazole-trimethoprim (BACTRIM DS) 800-160 mg per tablet Take 1 tablet (160 mg of trimethoprim total) by mouth Every Monday, Wednesday, and Friday. 18 tablet 0   ???  temozolomide (TEMODAR) 140 mg capsule Take 1 capsule (140 mg total) by mouth daily. Take one hour before radiation, and one hour after nausea medication. 42 capsule 0     No current facility-administered medications for this visit.       Allergies   Allergen Reactions   ??? Niacin Anaphylaxis and Swelling       Patient Active Problem List   Diagnosis   ??? Brain tumor (CMS-HCC)   ??? Diffuse astrocytoma (CMS-HCC)   ??? Anxiety   ??? Secondary seizure disorder (CMS-HCC)   ??? Astrocytoma (CMS-HCC)       Reviewed and up to date in Epic.    Appropriateness of Therapy     Acute infections noted within Epic:  No active infections  Patient reported infection: None    Is medication and dose appropriate based on diagnosis and infection status? Yes    Prescription has been clinically reviewed: Yes      Baseline Quality of Life Assessment      How many days over the past month did your astrocytoma  keep you from your normal activities? For example, brushing your teeth or getting up in the morning. 0    Financial Information     Medication Assistance provided: None Required    Anticipated copay of $0 / 30 days reviewed with patient. Verified delivery address.    Delivery Information     Scheduled delivery date: 10/24/21    Expected start date: 10/24/21    Medication will be delivered via Same Day Courier to the prescription address in [facility].  This shipment will not require a signature.      Explained the services we provide at [facility] and that each month we would call to set up refills.  Stressed importance of returning phone calls so that we could ensure they receive their medications in time each month.  Informed patient that we should be setting up refills 7-10 days prior to when they will run out of medication.  A pharmacist will reach out to perform a clinical assessment periodically.  Informed patient that a welcome packet, containing information about our pharmacy and other support services, a Notice of Privacy Practices, and a drug information handout will be sent.      The patient or caregiver noted above participated in the development of this care plan and knows that they can request review of or adjustments to the care plan at any time.      Patient or caregiver verbalized understanding of the above information as well as how to contact the pharmacy at [phone] option 4 with any questions/concerns.  The pharmacy is open Monday through Friday 8:30am-4:30pm.  A pharmacist is available 24/7 via pager to answer any clinical questions they may have.    Patient Specific Needs     - Does the patient have any physical, cognitive, or cultural barriers? No    - Does the patient have adequate living arrangements? (i.e. the ability to store and take their medication appropriately) Yes    - Did you identify any home environmental safety or security hazards? No    - Patient prefers to have medications discussed with  Patient     - Is the patient or caregiver able to read and understand education materials at a high school level or above? Yes    - Patient's primary language is  English     - Is the patient high risk? Yes, patient is taking oral  chemotherapy. Appropriateness of therapy as been assessed    SOCIAL DETERMINANTS OF HEALTH     At the [facility], we have learned that life circumstances - like trouble affording food, housing, utilities, or transportation can affect the health of many of our patients.   That is why we wanted to ask: are you currently experiencing any life circumstances that are negatively impacting your health and/or quality of life? Patient declined to answer    Social Determinants of Health     Food Insecurity: Not on file   Tobacco Use: High Risk   ??? Smoking Tobacco Use: Every Day   ??? Smokeless Tobacco Use: Never   ??? Passive Exposure: Not on file   Transportation Needs: Not on file   Alcohol Use: Not on file   Housing/Utilities: Not on file   Substance Use: Not on file   Financial Resource Strain: Not on file   Physical Activity: Not on file   Health Literacy: Not on file   Stress: Not on file   Intimate Partner Violence: Not on file   Depression: Not on file   Social Connections: Not on file       Would you be willing to receive help with any of the needs that you have identified today? Not applicable       Jorge Contreras A [facility] Specialty Pharmacist

## 2021-10-27 ENCOUNTER — Ambulatory Visit: Admit: 2021-10-27 | Discharge: 2021-10-28

## 2021-10-27 NOTE — Unmapped (Signed)
Radiation Treatment Management Note    Encounter Date: 10/27/2021  Name:Jorge Contreras.  ZOX:[id number]    Diagnosis:    1. Diffuse astrocytoma (CMS-HCC)        ASSESSMENT AND PLAN:   Jorge Contreras. is a 41 y.o. 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 Undergoing temodar with daily RT    CURRENT TREATMENT:   Cumulative dose 600 cGy/ 6000 cGy  Fraction 3 / 30.    Cancer  We will continue on with the course of treatment as planned.  I have reviewed and approved the appropriate quality assurance and targeting imaging.      2. Toxicity  None  - Continue levetiracetam  - Continue dexamethasone taper    3. Follow Up:  We will reassess next week.      Subjective:   Jorge Contreras is doing well at the beginning of this treatments. He has no difficulty with mask setup, or treatment delivery. He is tolerating the temodar well with minimal nausea well controlled with zofran. He is tolerating the radiation well.  He has no other concerns or complaints.    PHYSICAL EXAMINATION:  Temp 35.6 ??C (96.1 ??F) (Temporal)  - Wt 73.5 kg (162 lb 1.6 oz)  - BMI 24.65 kg/m??   Karnofsky/Lansky Performance Status:  90,  Able to carry on normal activity; minor signs or symptoms of disease (ECOG equivalent 0)  General/Constitutional: Well-appearing, NAD   HEENT: Normocephalic, atraumatic, no scleral icterus   Skin: No suspicious lesions or rashes  Pulmonary: No respiratory distress or increased work of breathing   Abdominal: Non distended  Musculoskeletal: Full range of motion in the extremities, without edema   Neurologic: Alert and oriented to conversation. CN II-XII intact. Strength 5/5 in all 4 extremities. Sensation normal in all 4 extremities. Psychiatric: Appropriate affect and judgement. Some repetition of questions and lack of memory of prior events        Electronically Signed:  Sunday Shams, MD  Radiation Oncology Pgy-3  [facility]            ATTENDING ATTESTATION:    I saw and evaluated/examined the patient, participating in the key portions of the service.  I discussed the findings, assessment, and plan of care with the resident.  I agree with the findings and plan as documented in the resident's note.      Jorge Contreras is tolerating treatment well, early in treatment.  Continue treatment as planned.    I have personally reviewed daily imaging, which shows good alignment to the initial setup and plan.       Mina Marble, MD, PhD  Assistant Professor  Department of Radiation Oncology  [facility]  [address], [id number]  [place], [place] [address]  O: [phone]  P: [phone]

## 2021-10-27 NOTE — Unmapped (Signed)
10/27/2021    Dose Site Summary: 600/6000cGy    Subjective/Assessment/Recommendations:    1. Nutrition: No issues  2. G-Tube Status: N/A  3. Mucositis: No problems  4. Chemo: Administered before radiation  5. Nausea/Vomiting: Controlled  6. Skin: No problems  7. Tobacco: Using; cigars  8. Fatigue: No issues  9. Pain:Denies pain  10. Elimination: No issues  11. Prescription Needs: None  12. Psychosocial: Accompanied by family member/other today  13. Other: None

## 2021-10-27 NOTE — Unmapped (Signed)
Encounter addended by: Ruben Reason, MD on: 10/27/2021 1:12 PM   Actions taken: Clinical Note Signed

## 2021-10-28 ENCOUNTER — Other Ambulatory Visit: Admit: 2021-10-28 | Discharge: 2021-10-29

## 2021-10-28 ENCOUNTER — Ambulatory Visit: Admit: 2021-10-28 | Discharge: 2021-10-29

## 2021-10-28 DIAGNOSIS — D496 Neoplasm of unspecified behavior of brain: Principal | ICD-10-CM

## 2021-10-28 LAB — CBC W/ AUTO DIFF
BASOPHILS ABSOLUTE COUNT: 0.1 10*9/L (ref 0.0–0.1)
BASOPHILS RELATIVE PERCENT: 1.1 %
EOSINOPHILS ABSOLUTE COUNT: 0.1 10*9/L (ref 0.0–0.5)
EOSINOPHILS RELATIVE PERCENT: 1.2 %
HEMATOCRIT: 37.6 % — ABNORMAL LOW (ref 39.0–48.0)
HEMOGLOBIN: 13.2 g/dL (ref 12.9–16.5)
LYMPHOCYTES ABSOLUTE COUNT: 2 10*9/L (ref 1.1–3.6)
LYMPHOCYTES RELATIVE PERCENT: 22.8 %
MEAN CORPUSCULAR HEMOGLOBIN CONC: 35 g/dL (ref 32.0–36.0)
MEAN CORPUSCULAR HEMOGLOBIN: 31.6 pg (ref 25.9–32.4)
MEAN CORPUSCULAR VOLUME: 90.1 fL (ref 77.6–95.7)
MEAN PLATELET VOLUME: 7.4 fL (ref 6.8–10.7)
MONOCYTES ABSOLUTE COUNT: 0.8 10*9/L (ref 0.3–0.8)
MONOCYTES RELATIVE PERCENT: 8.8 %
NEUTROPHILS ABSOLUTE COUNT: 5.7 10*9/L (ref 1.8–7.8)
NEUTROPHILS RELATIVE PERCENT: 66.1 %
PLATELET COUNT: 329 10*9/L (ref 150–450)
RED BLOOD CELL COUNT: 4.18 10*12/L — ABNORMAL LOW (ref 4.26–5.60)
RED CELL DISTRIBUTION WIDTH: 13.5 % (ref 12.2–15.2)
WBC ADJUSTED: 8.7 10*9/L (ref 3.6–11.2)

## 2021-10-28 LAB — COMPREHENSIVE METABOLIC PANEL
ALBUMIN: 3.8 g/dL (ref 3.4–5.0)
ALKALINE PHOSPHATASE: 63 U/L (ref 46–116)
ALT (SGPT): 8 U/L — ABNORMAL LOW (ref 10–49)
ANION GAP: 6 mmol/L (ref 5–14)
AST (SGOT): 11 U/L (ref <=34)
BILIRUBIN TOTAL: 0.3 mg/dL (ref 0.3–1.2)
BLOOD UREA NITROGEN: 8 mg/dL — ABNORMAL LOW (ref 9–23)
BUN / CREAT RATIO: 9
CALCIUM: 9.3 mg/dL (ref 8.7–10.4)
CHLORIDE: 104 mmol/L (ref 98–107)
CO2: 30 mmol/L (ref 20.0–31.0)
CREATININE: 0.85 mg/dL
EGFR CKD-EPI (2021) MALE: >90 mL/min/{1.73_m2} (ref >=60)
GLUCOSE RANDOM: 88 mg/dL (ref 70–179)
POTASSIUM: 4.6 mmol/L (ref 3.4–4.8)
PROTEIN TOTAL: 7.4 g/dL (ref 5.7–8.2)
SODIUM: 140 mmol/L (ref 135–145)

## 2021-10-29 ENCOUNTER — Ambulatory Visit: Admit: 2021-10-29 | Discharge: 2021-10-30

## 2021-10-30 ENCOUNTER — Ambulatory Visit: Admit: 2021-10-30 | Discharge: 2021-10-31

## 2021-10-31 ENCOUNTER — Ambulatory Visit: Admit: 2021-10-31 | Discharge: 2021-11-01

## 2021-11-02 ENCOUNTER — Ambulatory Visit: Admit: 2021-11-02 | Discharge: 2021-11-03 | Payer: MEDICAID

## 2021-11-02 MED ADMIN — albuterol 2.5 mg /3 mL (0.083 %) nebulizer solution 2.5 mg: 2.5 mg | RESPIRATORY_TRACT | @ 17:00:00

## 2021-11-02 MED ADMIN — pentamidine (PENTAM) inhalation solution: 300 mg | RESPIRATORY_TRACT | @ 18:00:00 | Stop: 2021-11-02

## 2021-11-02 NOTE — Unmapped (Signed)
Pt here for pentamidine. Treatment completed. AVS printed. Pt stable at discharge via self ambulation.

## 2021-11-03 ENCOUNTER — Ambulatory Visit: Admit: 2021-11-03 | Discharge: 2021-11-04

## 2021-11-03 MED ORDER — DEXAMETHASONE 2 MG TABLET
ORAL_TABLET | 0 refills | 0 days | Status: CP
Start: 2021-11-03 — End: ?
  Filled 2021-11-03: qty 30, 30d supply, fill #0

## 2021-11-03 NOTE — Unmapped (Signed)
INFORMED CONSENT DOCUMENTATION           Visit Date: 11/03/2021           Study IRB #: [id number]           [facility] #: [id number]           Patient Name: Jorge Contreras.           MRN: [id number]           DOB: 01/26/2020           Dr. Flonnie Hailstone and I met with the patient, Jorge Contreras, to discuss consent for the sleep study titled: Characterizing the effect of different modalities of brain radiation on sleep, mood, and function in adult metastatic and primary brain tumor patients. The consent form was reviewed, including discussion of risks & benefits, procedures, confidentiality, time commitments involved, study contact list, and the option to withdraw at any time.         The patient had ample time to consider participation in the study, in the absence of coercion or undue influence. Patient was offered an opportunity to ask questions and these questions were answered to their satisfaction. Patient verbalized understanding of information presented. The patient has signed and dated the following documents in my presence: HIPAA Authorization Form version dated 05-21-2021 and informed consent document version dated 05-21-2021.           Signed and dated copies of all consent documents were given to the patient. Every effort to maintain confidentiality will be employed.           No procedures specifically related to the research were performed prior to informed consent being signed.            After the patient consented and eligibility criteria was signed, the patient completed the composite sleep study questionnairre and the sleep diary.     Follow up will occur 1 month after the patient???s final date of radiation

## 2021-11-03 NOTE — Unmapped (Signed)
11/03/2021    Dose Site Summary:    Rx:Rt Frontal: 11/03/2021: 1,600/6,000 cGy    Subjective/Assessment/Recommendations:    1. Ataxia/balance: No issues except one episode of dizziness after vomiting episode 3/17  2. Confusion: memory is worse since started radiation  3. Insomnia: No issues  4. Nausea/Vomiting: taking zofran before Temodar but still having nausea with emesis x2, inst to take zofran 3 times/day to see if that helps  5. Nutrition: No issues and Weight stable  6. Fatigue: Mild  7. Pain: Mild and Controlled  8. Elimination: Bowel Constipation, recommended Miralax  9. Prescription Needs: None  10. Psychosocial: No issues, Has home support and Accompanied by family member/other today      Wt Readings from Last 6 Encounters:   11/03/21 74.8 kg (165 lb)   11/02/21 74.6 kg (164 lb 5.7 oz)   10/27/21 73.5 kg (162 lb 1.6 oz)   10/07/21 74.3 kg (163 lb 12.8 oz)   10/07/21 74.3 kg (163 lb 12.8 oz)   09/19/21 71.1 kg (156 lb 12 oz)

## 2021-11-03 NOTE — Unmapped (Cosign Needed)
Radiation Treatment Management Note    Encounter Date: 11/03/2021  Name:Jorge Contreras.  ZOX:[id number]    Diagnosis:    1. Diffuse astrocytoma (CMS-HCC)    2. Astrocytoma (CMS-HCC)        ASSESSMENT AND PLAN:   Jorge Contreras. is a 40 y.o. 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 Undergoing temodar with daily RT    CURRENT TREATMENT:   Cumulative dose 1600 cGy/ 6000 cGy  Fraction 8 / 30.    1. Cancer  We will continue on with the course of treatment as planned.  I have reviewed and approved the appropriate quality assurance and targeting imaging.      2. Toxicity  - Seizure: Continue levetiracetam  - HA: Restart dexamethasone 2mg  daily in AM.  - Nausea: Continue Zofran with bowel regimen    3. Follow Up:  We will reassess next week.      Subjective:   Jorge Contreras continues to do well with his treatments. This week he notes increasing nausea and new mild headaches that have become a daily occurrence. He denies any other changes at this time. He has no difficulty with mask setup, or treatment delivery. He has no other concerns or complaints.    PHYSICAL EXAMINATION:  Temp 37.1 ??C (98.7 ??F) (Temporal)  - Wt 74.8 kg (165 lb)  - BMI 25.09 kg/m??   Karnofsky/Lansky Performance Status:  90,  Able to carry on normal activity; minor signs or symptoms of disease (ECOG equivalent 0)  General/Constitutional: Well-appearing, NAD   HEENT: Normocephalic, atraumatic, no scleral icterus   Skin: No suspicious lesions or rashes  Pulmonary: No respiratory distress or increased work of breathing   Abdominal: Non distended  Musculoskeletal: Full range of motion in the extremities, without edema   Neurologic: Alert and oriented to conversation. CN II-XII intact. Strength 5/5 in all 4 extremities. Sensation normal in all 4 extremities.   Psychiatric: Appropriate affect and judgement. Some repetition of questions and lack of memory of prior events        Electronically Signed:  Sunday Shams, MD  Radiation Oncology Pgy-3  [facility]

## 2021-11-04 ENCOUNTER — Other Ambulatory Visit: Admit: 2021-11-04 | Discharge: 2021-11-05

## 2021-11-04 ENCOUNTER — Ambulatory Visit: Admit: 2021-11-04 | Discharge: 2021-11-05

## 2021-11-04 DIAGNOSIS — C719 Malignant neoplasm of brain, unspecified: Principal | ICD-10-CM

## 2021-11-04 LAB — CBC W/ AUTO DIFF
BASOPHILS ABSOLUTE COUNT: 0.1 10*9/L (ref 0.0–0.1)
BASOPHILS RELATIVE PERCENT: 1 %
EOSINOPHILS ABSOLUTE COUNT: 0.1 10*9/L (ref 0.0–0.5)
EOSINOPHILS RELATIVE PERCENT: 1 %
HEMATOCRIT: 38 % — ABNORMAL LOW (ref 39.0–48.0)
HEMOGLOBIN: 13.3 g/dL (ref 12.9–16.5)
LYMPHOCYTES ABSOLUTE COUNT: 0.6 10*9/L — ABNORMAL LOW (ref 1.1–3.6)
LYMPHOCYTES RELATIVE PERCENT: 7.5 %
MEAN CORPUSCULAR HEMOGLOBIN CONC: 34.9 g/dL (ref 32.0–36.0)
MEAN CORPUSCULAR HEMOGLOBIN: 31.4 pg (ref 25.9–32.4)
MEAN CORPUSCULAR VOLUME: 89.8 fL (ref 77.6–95.7)
MEAN PLATELET VOLUME: 7.6 fL (ref 6.8–10.7)
MONOCYTES ABSOLUTE COUNT: 0.2 10*9/L — ABNORMAL LOW (ref 0.3–0.8)
MONOCYTES RELATIVE PERCENT: 2.9 %
NEUTROPHILS ABSOLUTE COUNT: 6.7 10*9/L (ref 1.8–7.8)
NEUTROPHILS RELATIVE PERCENT: 87.6 %
PLATELET COUNT: 394 10*9/L (ref 150–450)
RED BLOOD CELL COUNT: 4.23 10*12/L — ABNORMAL LOW (ref 4.26–5.60)
RED CELL DISTRIBUTION WIDTH: 13.3 % (ref 12.2–15.2)
WBC ADJUSTED: 7.7 10*9/L (ref 3.6–11.2)

## 2021-11-04 LAB — COMPREHENSIVE METABOLIC PANEL
ALBUMIN: 3.9 g/dL (ref 3.4–5.0)
ALKALINE PHOSPHATASE: 66 U/L (ref 46–116)
ALT (SGPT): 8 U/L — ABNORMAL LOW (ref 10–49)
ANION GAP: 5 mmol/L (ref 5–14)
AST (SGOT): 12 U/L (ref <=34)
BILIRUBIN TOTAL: 0.3 mg/dL (ref 0.3–1.2)
BLOOD UREA NITROGEN: 10 mg/dL (ref 9–23)
BUN / CREAT RATIO: 11
CALCIUM: 9.5 mg/dL (ref 8.7–10.4)
CHLORIDE: 103 mmol/L (ref 98–107)
CO2: 32 mmol/L — ABNORMAL HIGH (ref 20.0–31.0)
CREATININE: 0.87 mg/dL
EGFR CKD-EPI (2021) MALE: >90 mL/min/{1.73_m2} (ref >=60)
GLUCOSE RANDOM: 69 mg/dL — ABNORMAL LOW (ref 70–179)
POTASSIUM: 4.5 mmol/L (ref 3.4–4.8)
PROTEIN TOTAL: 7.4 g/dL (ref 5.7–8.2)
SODIUM: 140 mmol/L (ref 135–145)

## 2021-11-05 ENCOUNTER — Ambulatory Visit: Admit: 2021-11-05 | Discharge: 2021-11-06

## 2021-11-06 ENCOUNTER — Ambulatory Visit: Admit: 2021-11-06 | Discharge: 2021-11-07

## 2021-11-07 ENCOUNTER — Ambulatory Visit: Admit: 2021-11-07 | Discharge: 2021-11-08

## 2021-11-10 ENCOUNTER — Ambulatory Visit: Admit: 2021-11-10 | Discharge: 2021-11-11

## 2021-11-11 ENCOUNTER — Ambulatory Visit: Admit: 2021-11-11 | Discharge: 2021-11-12

## 2021-11-11 ENCOUNTER — Other Ambulatory Visit: Admit: 2021-11-11 | Discharge: 2021-11-12

## 2021-11-11 DIAGNOSIS — C719 Malignant neoplasm of brain, unspecified: Principal | ICD-10-CM

## 2021-11-11 LAB — CBC W/ AUTO DIFF
BASOPHILS ABSOLUTE COUNT: 0.1 10*9/L (ref 0.0–0.1)
BASOPHILS RELATIVE PERCENT: 1.2 %
EOSINOPHILS ABSOLUTE COUNT: 0.2 10*9/L (ref 0.0–0.5)
EOSINOPHILS RELATIVE PERCENT: 2.6 %
HEMATOCRIT: 36.8 % — ABNORMAL LOW (ref 39.0–48.0)
HEMOGLOBIN: 12.5 g/dL — ABNORMAL LOW (ref 12.9–16.5)
LYMPHOCYTES ABSOLUTE COUNT: 2 10*9/L (ref 1.1–3.6)
LYMPHOCYTES RELATIVE PERCENT: 24.2 %
MEAN CORPUSCULAR HEMOGLOBIN CONC: 34.1 g/dL (ref 32.0–36.0)
MEAN CORPUSCULAR HEMOGLOBIN: 30.9 pg (ref 25.9–32.4)
MEAN CORPUSCULAR VOLUME: 90.6 fL (ref 77.6–95.7)
MEAN PLATELET VOLUME: 7.2 fL (ref 6.8–10.7)
MONOCYTES ABSOLUTE COUNT: 1 10*9/L — ABNORMAL HIGH (ref 0.3–0.8)
MONOCYTES RELATIVE PERCENT: 12.5 %
NEUTROPHILS ABSOLUTE COUNT: 4.8 10*9/L (ref 1.8–7.8)
NEUTROPHILS RELATIVE PERCENT: 59.5 %
PLATELET COUNT: 398 10*9/L (ref 150–450)
RED BLOOD CELL COUNT: 4.06 10*12/L — ABNORMAL LOW (ref 4.26–5.60)
RED CELL DISTRIBUTION WIDTH: 13.8 % (ref 12.2–15.2)
WBC ADJUSTED: 8.1 10*9/L (ref 3.6–11.2)

## 2021-11-11 LAB — COMPREHENSIVE METABOLIC PANEL
ALBUMIN: 3.6 g/dL (ref 3.4–5.0)
ALKALINE PHOSPHATASE: 56 U/L (ref 46–116)
ALT (SGPT): 18 U/L (ref 10–49)
ANION GAP: 7 mmol/L (ref 5–14)
AST (SGOT): 14 U/L (ref <=34)
BILIRUBIN TOTAL: 0.2 mg/dL — ABNORMAL LOW (ref 0.3–1.2)
BLOOD UREA NITROGEN: 9 mg/dL (ref 9–23)
BUN / CREAT RATIO: 12
CALCIUM: 8.8 mg/dL (ref 8.7–10.4)
CHLORIDE: 105 mmol/L (ref 98–107)
CO2: 27 mmol/L (ref 20.0–31.0)
CREATININE: 0.74 mg/dL
EGFR CKD-EPI (2021) MALE: >90 mL/min/{1.73_m2} (ref >=60)
GLUCOSE RANDOM: 84 mg/dL (ref 70–179)
POTASSIUM: 4.1 mmol/L (ref 3.4–4.8)
PROTEIN TOTAL: 6.9 g/dL (ref 5.7–8.2)
SODIUM: 139 mmol/L (ref 135–145)

## 2021-11-11 NOTE — Unmapped (Cosign Needed)
Radiation Treatment Management Note    Encounter Date: 11/10/2021  Name:Jorge Contreras.  MWU:[id number]    Diagnosis:    1. Diffuse astrocytoma (CMS-HCC)        ASSESSMENT AND PLAN:   Jorge Coster. is a 41 y.o. 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 Undergoing temodar with daily RT    CURRENT TREATMENT:   Cumulative dose 2600 cGy/ 6000 cGy  Fraction 13 / 30.    1. Cancer  We will continue on with the course of treatment as planned.  I have reviewed and approved the appropriate quality assurance and targeting imaging.      2. Toxicity  - Seizure: Continue levetiracetam  - HA: Continue dexamethasone 2mg  daily in AM.  - Nausea: Continue Zofran with bowel regimen    3. Follow Up:  We will reassess next week.        Subjective:   Jorge Contreras continues to do well with his treatments. Since resuming the 2mg  Dexamethasone he has noticed stability with his HA and nausea. He is tolerating his temodar well.  He continues to have no difficulty with mask setup, or treatment delivery. He has no other concerns or complaints.    PHYSICAL EXAMINATION:  Temp 36.6 ??C (97.9 ??F) (Temporal)  - Wt 74.5 kg (164 lb 3.2 oz)  - BMI 24.97 kg/m??   Karnofsky/Lansky Performance Status:  90,  Able to carry on normal activity; minor signs or symptoms of disease (ECOG equivalent 0)  General/Constitutional: Well-appearing, NAD   HEENT: Normocephalic, atraumatic, no scleral icterus. Some hair loss at treatment site   Skin: No suspicious lesions or rashes  Pulmonary: No respiratory distress or increased work of breathing   Abdominal: Non distended  Musculoskeletal: Full range of motion in the extremities, without edema   Neurologic: Alert and oriented to conversation. CN II-XII intact. Strength 5/5 in all 4 extremities. Sensation normal in all 4 extremities.   Psychiatric: Appropriate affect and judgement. Some repetition of questions and lack of memory of prior events        Electronically Signed:  Sunday Shams, MD  Radiation Oncology Pgy-3  [facility]

## 2021-11-11 NOTE — Unmapped (Signed)
No voicemail option.  Unable to connect with patient.

## 2021-11-11 NOTE — Unmapped (Signed)
[facility] Clinical Assessment & Refill Coordination Note    Jorge Contreras., DOB: [birth year]  Phone: [phone] (home)     All above HIPAA information was verified with patient's family member, father Marcia Lepera.     Was a Nurse, learning disability used for this call? No    Specialty Medication(s):   Hematology/Oncology: Temozolomide 140mg      Current Outpatient Medications   Medication Sig Dispense Refill   ??? blood sugar diagnostic (ON CALL EXPRESS TEST STRIP) Strp Use to check blood sugar as directed with insulin 3 times a day & for symptoms of high or low blood sugar. 100 each 0   ??? blood-glucose meter Misc Use as instructed. 1 each 0   ??? cyclobenzaprine (FLEXERIL) 10 MG tablet Take 10 mg by mouth. (Patient not taking: Reported on 10/07/2021)     ??? dexAMETHasone (DECADRON) 2 MG tablet Take 1 tablet (2mg ) by mouth in the morning until 12/12/2021 40 tablet 0   ??? famotidine (PEPCID) 20 MG tablet Take 1 tablet (20 mg total) by mouth Two (2) times a day. 60 tablet 0   ??? insulin ASPART (NOVOLOG FLEXPEN U-100 INSULIN) 100 unit/mL (3 mL) injection pen Please check your blood glucose levels before every meal and at bedtime and correct according to this scale. Thank you. Correction insulin scale based on Insulin sensitivity factor 40:BG 160-200 = 1 unit BG 201-240 = 2 units BG 241-280 = 3 units BG 281-300 = 4 units BG > 300 = 5 units 15 mL 0   ??? lancets 30 gauge Misc Use to check blood sugar as directed with insulin 3 times a day & for symptoms of high or low blood sugar. 100 each 0   ??? lancing device Misc use as directed 1 each 0   ??? levETIRAcetam (KEPPRA) 750 MG tablet Take 1 tablet (750 mg total) by mouth Two (2) times a day. 60 tablet 1   ??? lidocaine (LIDODERM) 5 % patch Place 1 patch on the skin. (Patient not taking: Reported on 10/07/2021)     ??? ondansetron (ZOFRAN) 8 MG tablet Take one hour before chemotherapy and every 8 hours as needed for nausea 60 tablet 2   ??? pen needle, diabetic 32 gauge x 5/32 (4 mm) Ndle Use with insulin up to 4 times/day as needed. 100 each 0   ??? sulfamethoxazole-trimethoprim (BACTRIM DS) 800-160 mg per tablet Take 1 tablet (160 mg of trimethoprim total) by mouth Every Monday, Wednesday, and Friday. 18 tablet 0   ??? temozolomide (TEMODAR) 140 mg capsule Take 1 capsule (140 mg total) by mouth daily. Take one hour before radiation, and one hour after nausea medication. 42 capsule 0     No current facility-administered medications for this visit.        Changes to medications: Jerrold reports no changes at this time.    Allergies   Allergen Reactions   ??? Niacin Anaphylaxis and Swelling       Changes to allergies: No    SPECIALTY MEDICATION ADHERENCE     Temozolomide 140 mg: father unsure how many days of medicine on hand     Medication Adherence    Patient reported X missed doses in the last month: 0  Specialty Medication: Temozolomide 140 mg caps - 1 daily one hour prior to radiation  Patient is on additional specialty medications: No  Informant: father  Confirmed plan for next specialty medication refill: delivery by pharmacy  Refills needed for supportive medications: not  needed          Specialty medication(s) dose(s) confirmed: Regimen is correct and unchanged.     Are there any concerns with adherence? No    Adherence counseling provided? Not needed    CLINICAL MANAGEMENT AND INTERVENTION      Clinical Benefit Assessment:    Do you feel the medicine is effective or helping your condition? Yes    Clinical Benefit counseling provided? Not needed    Adverse Effects Assessment:    Are you experiencing any side effects? No    Are you experiencing difficulty administering your medicine? No    Quality of Life Assessment:    Quality of Life    Rheumatology  Oncology  2. On a scale of 1-10, how would you rate your ability to manage side effects associated with your specialty medication? (1=no issues, 10 = unable to take medication due to side effects): 1  Dermatology  Cystic Fibrosis How many days over the past month did your astrocytoma  keep you from your normal activities? For example, brushing your teeth or getting up in the morning. Patient declined to answer    Have you discussed this with your provider? Not needed    Acute Infection Status:    Acute infections noted within Epic:  No active infections  Patient reported infection: None    Therapy Appropriateness:    Is therapy appropriate and patient progressing towards therapeutic goals? Yes, therapy is appropriate and should be continued    DISEASE/MEDICATION-SPECIFIC INFORMATION      N/A    PATIENT SPECIFIC NEEDS     - Does the patient have any physical, cognitive, or cultural barriers? No    - Is the patient high risk? Yes, patient is taking oral chemotherapy. Appropriateness of therapy as been assessed    - Does the patient require a Care Management Plan? No     SOCIAL DETERMINANTS OF HEALTH     At the [facility], we have learned that life circumstances - like trouble affording food, housing, utilities, or transportation can affect the health of many of our patients.   That is why we wanted to ask: are you currently experiencing any life circumstances that are negatively impacting your health and/or quality of life? Patient declined to answer    Social Determinants of Health     Financial Resource Strain: Not on file   Internet Connectivity: Not on file   Food Insecurity: Not on file   Tobacco Use: High Risk   ??? Smoking Tobacco Use: Every Day   ??? Smokeless Tobacco Use: Never   ??? Passive Exposure: Not on file   Housing/Utilities: Not on file   Alcohol Use: Not on file   Transportation Needs: Not on file   Substance Use: Not on file   Health Literacy: Not on file   Physical Activity: Not on file   Interpersonal Safety: Not on file   Stress: Not on file   Intimate Partner Violence: Not on file   Depression: Not on file   Social Connections: Not on file       Would you be willing to receive help with any of the needs that you have identified today? Not applicable       SHIPPING     Specialty Medication(s) to be Shipped:   Hematology/Oncology: Temozolomide 140mg     Other medication(s) to be shipped: ondansetron     Changes to insurance: patient needs to complete application for full PAP benefits  Delivery Scheduled: Yes, Expected medication delivery date: 11/18/21.     Medication will be delivered via Next Day Courier to the confirmed prescription address in [facility].    The patient will receive a drug information handout for each medication shipped and additional FDA Medication Guides as required.  Verified that patient has previously received a Conservation officer, historic buildings and a Surveyor, mining.    The patient or caregiver noted above participated in the development of this care plan and knows that they can request review of or adjustments to the care plan at any time.      All of the patient's questions and concerns have been addressed.    Breck Coons [facility] Specialty Pharmacist

## 2021-11-12 ENCOUNTER — Ambulatory Visit: Admit: 2021-11-12 | Discharge: 2021-11-13

## 2021-11-12 NOTE — Unmapped (Signed)
Busy signal. Unable to leave message

## 2021-11-13 ENCOUNTER — Ambulatory Visit: Admit: 2021-11-13 | Discharge: 2021-11-14

## 2021-11-14 ENCOUNTER — Ambulatory Visit: Admit: 2021-11-14 | Discharge: 2021-11-15

## 2021-11-17 ENCOUNTER — Ambulatory Visit: Admit: 2021-11-17

## 2021-11-17 ENCOUNTER — Ambulatory Visit: Admit: 2021-11-17 | Discharge: 2021-11-18

## 2021-11-17 ENCOUNTER — Ambulatory Visit: Admit: 2021-11-17 | Discharge: 2021-11-18 | Attending: Radiation Oncology | Primary: Radiation Oncology

## 2021-11-17 ENCOUNTER — Ambulatory Visit: Admit: 2021-11-17 | Discharge: 2021-11-20 | Attending: Radiation Oncology | Primary: Radiation Oncology

## 2021-11-17 ENCOUNTER — Ambulatory Visit: Admit: 2021-11-17 | Discharge: 2021-11-26 | Attending: Radiation Oncology | Primary: Radiation Oncology

## 2021-11-17 ENCOUNTER — Ambulatory Visit: Admit: 2021-11-17 | Discharge: 2021-12-02 | Attending: Radiation Oncology | Primary: Radiation Oncology

## 2021-11-17 ENCOUNTER — Ambulatory Visit: Admit: 2021-11-17 | Discharge: 2021-11-27 | Attending: Radiation Oncology | Primary: Radiation Oncology

## 2021-11-17 MED FILL — ONDANSETRON HCL 8 MG TABLET: 20 days supply | Qty: 60 | Fill #1

## 2021-11-17 MED FILL — TEMOZOLOMIDE 140 MG CAPSULE: ORAL | 12 days supply | Qty: 12 | Fill #1

## 2021-11-18 ENCOUNTER — Ambulatory Visit: Admit: 2021-11-18 | Discharge: 2021-11-19

## 2021-11-18 ENCOUNTER — Other Ambulatory Visit: Admit: 2021-11-18 | Discharge: 2021-11-19

## 2021-11-18 DIAGNOSIS — C719 Malignant neoplasm of brain, unspecified: Principal | ICD-10-CM

## 2021-11-18 LAB — COMPREHENSIVE METABOLIC PANEL
ALBUMIN: 3.7 g/dL (ref 3.4–5.0)
ALKALINE PHOSPHATASE: 51 U/L (ref 46–116)
ALT (SGPT): 20 U/L (ref 10–49)
ANION GAP: 9 mmol/L (ref 5–14)
AST (SGOT): 11 U/L (ref <=34)
BILIRUBIN TOTAL: 0.4 mg/dL (ref 0.3–1.2)
BLOOD UREA NITROGEN: 12 mg/dL (ref 9–23)
BUN / CREAT RATIO: 13
CALCIUM: 9 mg/dL (ref 8.7–10.4)
CHLORIDE: 107 mmol/L (ref 98–107)
CO2: 27 mmol/L (ref 20.0–31.0)
CREATININE: 0.91 mg/dL
EGFR CKD-EPI (2021) MALE: >90 mL/min/{1.73_m2} (ref >=60)
GLUCOSE RANDOM: 77 mg/dL (ref 70–179)
POTASSIUM: 3.5 mmol/L (ref 3.4–4.8)
PROTEIN TOTAL: 6.7 g/dL (ref 5.7–8.2)
SODIUM: 143 mmol/L (ref 135–145)

## 2021-11-18 LAB — CBC W/ AUTO DIFF
BASOPHILS ABSOLUTE COUNT: 0.1 10*9/L (ref 0.0–0.1)
BASOPHILS RELATIVE PERCENT: 1 %
EOSINOPHILS ABSOLUTE COUNT: 0.2 10*9/L (ref 0.0–0.5)
EOSINOPHILS RELATIVE PERCENT: 1.9 %
HEMATOCRIT: 37.9 % — ABNORMAL LOW (ref 39.0–48.0)
HEMOGLOBIN: 12.9 g/dL (ref 12.9–16.5)
LYMPHOCYTES ABSOLUTE COUNT: 2.1 10*9/L (ref 1.1–3.6)
LYMPHOCYTES RELATIVE PERCENT: 20.3 %
MEAN CORPUSCULAR HEMOGLOBIN CONC: 34 g/dL (ref 32.0–36.0)
MEAN CORPUSCULAR HEMOGLOBIN: 31 pg (ref 25.9–32.4)
MEAN CORPUSCULAR VOLUME: 91 fL (ref 77.6–95.7)
MEAN PLATELET VOLUME: 6.8 fL (ref 6.8–10.7)
MONOCYTES ABSOLUTE COUNT: 0.7 10*9/L (ref 0.3–0.8)
MONOCYTES RELATIVE PERCENT: 6.9 %
NEUTROPHILS ABSOLUTE COUNT: 7.3 10*9/L (ref 1.8–7.8)
NEUTROPHILS RELATIVE PERCENT: 69.9 %
PLATELET COUNT: 409 10*9/L (ref 150–450)
RED BLOOD CELL COUNT: 4.17 10*12/L — ABNORMAL LOW (ref 4.26–5.60)
RED CELL DISTRIBUTION WIDTH: 14 % (ref 12.2–15.2)
WBC ADJUSTED: 10.5 10*9/L (ref 3.6–11.2)

## 2021-11-19 ENCOUNTER — Ambulatory Visit: Admit: 2021-11-19 | Discharge: 2021-11-20

## 2021-11-19 NOTE — Unmapped (Signed)
11/19/2021    Dose Site Summary:    Subjective/Assessment/Recommendations:    1. Nutrition: Eating well  2. G-Tube Status: N/A  3. Mucositis: No problems  4. Chemo: pt is doing both chemo and radiation  5. Nausea/Vomiting: Taking anti-emetic medication  6. Skin: No problems  7. Tobacco: pt states he smokes a 1/2 pk a day  8. Fatigue: No issues  9. Pain:Denies pain  10. Elimination: No issues  11. Prescription Needs: Needs refills for chemo pills  12. Psychosocial: Accompanied by family member/other today

## 2021-11-19 NOTE — Unmapped (Signed)
Pt requested to speak to Dr. Flonnie Hailstone regarding refill for chemotherapy pill-his last dose is Saturday. Pt states he is no longer taking Keppra but denies any known seizures. After clarifying, pt states he does take the Dexamethasone pill as prescribed. Pts spouse wanted an update on future schedule. She was under the impression the pt would have a 4 week break from radiation and begin 6 more weeks of radiation.    Dr. Flonnie Hailstone spoke with pt and stated she would check in with the pharmacy regarding the paperwork that was turned in for the chemo pill and notify pt of its status.    Dr. Marijo File pt to be off of Keppra with no known events.    Dr. Flonnie Hailstone clarified that pt will not need 6 more weeks of radiation, after a 4 week break the pt will continue 6 more months of oral chemotherapy. Pt and spouse verbalized understanding and were grateful for clarifications.

## 2021-11-20 ENCOUNTER — Ambulatory Visit: Admit: 2021-11-20 | Discharge: 2021-11-21

## 2021-11-21 ENCOUNTER — Ambulatory Visit: Admit: 2021-11-21 | Discharge: 2021-11-22

## 2021-11-24 ENCOUNTER — Ambulatory Visit: Admit: 2021-11-24 | Discharge: 2021-11-25

## 2021-11-25 ENCOUNTER — Other Ambulatory Visit: Admit: 2021-11-25 | Discharge: 2021-11-26 | Payer: MEDICAID

## 2021-11-25 ENCOUNTER — Ambulatory Visit: Admit: 2021-11-25 | Discharge: 2021-11-26

## 2021-11-25 DIAGNOSIS — C719 Malignant neoplasm of brain, unspecified: Principal | ICD-10-CM

## 2021-11-25 LAB — CBC W/ AUTO DIFF
BASOPHILS ABSOLUTE COUNT: 0.2 10*9/L — ABNORMAL HIGH (ref 0.0–0.1)
BASOPHILS RELATIVE PERCENT: 2.3 %
EOSINOPHILS ABSOLUTE COUNT: 0.2 10*9/L (ref 0.0–0.5)
EOSINOPHILS RELATIVE PERCENT: 3.1 %
HEMATOCRIT: 39.8 % (ref 39.0–48.0)
HEMOGLOBIN: 14 g/dL (ref 12.9–16.5)
LYMPHOCYTES ABSOLUTE COUNT: 1.7 10*9/L (ref 1.1–3.6)
LYMPHOCYTES RELATIVE PERCENT: 21.3 %
MEAN CORPUSCULAR HEMOGLOBIN CONC: 35.1 g/dL (ref 32.0–36.0)
MEAN CORPUSCULAR HEMOGLOBIN: 32.1 pg (ref 25.9–32.4)
MEAN CORPUSCULAR VOLUME: 91.4 fL (ref 77.6–95.7)
MEAN PLATELET VOLUME: 7.2 fL (ref 6.8–10.7)
MONOCYTES ABSOLUTE COUNT: 0.7 10*9/L (ref 0.3–0.8)
MONOCYTES RELATIVE PERCENT: 9 %
NEUTROPHILS ABSOLUTE COUNT: 5 10*9/L (ref 1.8–7.8)
NEUTROPHILS RELATIVE PERCENT: 64.3 %
PLATELET COUNT: 388 10*9/L (ref 150–450)
RED BLOOD CELL COUNT: 4.35 10*12/L (ref 4.26–5.60)
RED CELL DISTRIBUTION WIDTH: 13.7 % (ref 12.2–15.2)
WBC ADJUSTED: 7.8 10*9/L (ref 3.6–11.2)

## 2021-11-25 LAB — COMPREHENSIVE METABOLIC PANEL
ALBUMIN: 4.1 g/dL (ref 3.4–5.0)
ALKALINE PHOSPHATASE: 53 U/L (ref 46–116)
ALT (SGPT): 19 U/L (ref 10–49)
ANION GAP: 9 mmol/L (ref 5–14)
AST (SGOT): 13 U/L (ref <=34)
BILIRUBIN TOTAL: 0.5 mg/dL (ref 0.3–1.2)
BLOOD UREA NITROGEN: 12 mg/dL (ref 9–23)
BUN / CREAT RATIO: 15
CALCIUM: 9.3 mg/dL (ref 8.7–10.4)
CHLORIDE: 105 mmol/L (ref 98–107)
CO2: 26 mmol/L (ref 20.0–31.0)
CREATININE: 0.79 mg/dL
EGFR CKD-EPI (2021) MALE: >90 mL/min/{1.73_m2} (ref >=60)
GLUCOSE RANDOM: 87 mg/dL (ref 70–179)
POTASSIUM: 4 mmol/L (ref 3.4–4.8)
PROTEIN TOTAL: 7.6 g/dL (ref 5.7–8.2)
SODIUM: 140 mmol/L (ref 135–145)

## 2021-11-25 NOTE — Unmapped (Signed)
Pt due for weekly status check today after treatment,he was not found in the waiting room.Attempted to call pt's cell phone X 2 with no answer.Status check changed to tomorrow 11/26/21.

## 2021-11-25 NOTE — Unmapped (Deleted)
11/25/2021    Dose Site Summary:  SiteLast TxDose  Rx:Rt Frontal: 11/25/2021: 4,800/6,000 cGy    Subjective/Assessment/Recommendations:    1. Ataxia/balance: {RADONCNURSEATAXIA/BALANCE:49707}  2. Confusion: {RADONCNURSECONFUSION:49705}  3. Insomnia: {RADONCNURSEINSOMNIA:49706}  4. Nausea/Vomiting: {RADONCNURSEN/V:49096}  5. Nutrition: {RADONCNURSEH&NUTRITION:49071}  6. Fatigue: {RADONCNURSINGFATIGUE:49054}  7. Pain: {RADONCNURSEPAIN:49067}  8. Elimination: {RADONCNURSEELIMINATION:49069}  9. Prescription Needs: {RADONCNURSERXNEEDS:49068}  10. Psychosocial: {RADONCNURSEPSYCHOSOCIAL:49070}  11. Other: ***

## 2021-11-26 ENCOUNTER — Ambulatory Visit: Admit: 2021-11-26 | Discharge: 2021-11-27

## 2021-11-26 NOTE — Unmapped (Unsigned)
11/26/2021  SiteLast TxDose  Rx:Rt Frontal: 11/26/2021: 5,000/6,000 cGy    Subjective/Assessment/Recommendations: Jorge Contreras presents to Radiation Oncology clinic for status check.    1. Fatigue: No issues  2. Pain: Intermittent headaches and takes Aleve ~ 1x/day  3. Nausea: Zofran 1x/day controls nausea  4. Prescription Needs: None  5. Psychosocial: Has home support, accompanied by his girl friend    Wt Readings from Last 6 Encounters:   11/26/21 76 kg (167 lb 9.6 oz)   11/19/21 77.1 kg (169 lb 14.4 oz)   11/10/21 74.5 kg (164 lb 3.2 oz)   11/03/21 74.8 kg (165 lb)   11/02/21 74.6 kg (164 lb 5.7 oz)   10/27/21 73.5 kg (162 lb 1.6 oz)

## 2021-11-27 ENCOUNTER — Ambulatory Visit: Admit: 2021-11-27 | Discharge: 2021-11-28

## 2021-11-28 ENCOUNTER — Ambulatory Visit: Admit: 2021-11-28 | Discharge: 2021-11-29
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
## 2021-12-01 ENCOUNTER — Ambulatory Visit: Admit: 2021-12-01 | Discharge: 2021-12-02

## 2021-12-01 MED ORDER — DEXAMETHASONE 1 MG TABLET
ORAL_TABLET | 0 refills | 0 days | Status: CP
Start: 2021-12-01 — End: ?

## 2021-12-01 MED ORDER — MOMETASONE 0.1 % TOPICAL CREAM
1 refills | 0 days | Status: CP
Start: 2021-12-01 — End: 2022-12-01

## 2021-12-01 NOTE — Unmapped (Signed)
12/01/2021  SiteLast TxDose  Rx:Rt Frontal: 12/01/2021: 5,600/6,000 cGy    Subjective/Assessment/Recommendations: Jorge Contreras presents to Radiation Oncology clinic for status check with astrocytoma.    1. Fatigue: Medium to low energy level  2. Pain: Takes Aleve ~ 1-2x/week for intermittent headaches  3. Elimination: No issues  4. Prescription Needs: None  5. Psychosocial: Has home support, accompanied by his girl friend    Wt Readings from Last 6 Encounters:   12/01/21 76.4 kg (168 lb 6.4 oz)   11/26/21 76 kg (167 lb 9.6 oz)   11/19/21 77.1 kg (169 lb 14.4 oz)   11/10/21 74.5 kg (164 lb 3.2 oz)   11/03/21 74.8 kg (165 lb)   11/02/21 74.6 kg (164 lb 5.7 oz)

## 2021-12-02 ENCOUNTER — Ambulatory Visit: Admit: 2021-12-02 | Discharge: 2021-12-03

## 2021-12-03 ENCOUNTER — Ambulatory Visit: Admit: 2021-12-03 | Discharge: 2021-12-04

## 2021-12-15 DIAGNOSIS — C719 Malignant neoplasm of brain, unspecified: Principal | ICD-10-CM

## 2021-12-15 NOTE — Unmapped (Signed)
Hi Ann,    Wanted to let you know that Mr. Perillo's fiance called the radiation triage line asking when his MRI will be. Upon chart review I do not see an MRI ordered nor any follow ups with your team. Dr. Flonnie Hailstone thought she had heard he might require more paperwork regarding this?     Thanks,   Denny Peon, Charity fundraiser  Radiation Oncology

## 2021-12-18 NOTE — Unmapped (Signed)
Pts fiance Fredonia Highland called to request pts upcoming appointment times. RN returned call, notified up upcoming MRI, lab appt, and appt with Dr. Reather Converse.

## 2021-12-29 NOTE — Unmapped (Signed)
Radiation Treatment Management Note    Encounter Date: 11/17/2021  Name:Jorge Contreras.  ZOX:[id number]    Diagnosis:    1. Astrocytoma (CMS-HCC) [C71.9 (ICD-10-CM)]        ASSESSMENT AND PLAN:   Jorge Contreras. is a 41 y.o. 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 Undergoing temodar with daily RT    CURRENT TREATMENT:   Cumulative dose 3600 cGy/ 6000 cGy  Fraction 18 / 30.    1. Cancer  We will continue on with the course of treatment as planned.  I have reviewed and approved the appropriate quality assurance and targeting imaging.      2. Toxicity  - Seizure: Continue levetiracetam  - HA: Continue dexamethasone 2mg  daily in AM.  - Nausea: Continue Zofran with bowel regimen    3. Follow Up:  We will reassess next week.        Subjective:   Jorge Contreras continues to do well with his treatments. Since resuming the 2mg  Dexamethasone he has noticed stability with his HA and nausea. He is tolerating his temodar well.  Continues to have hair loss at treatment site.  He has no other concerns or complaints.    PHYSICAL EXAMINATION:  There were no vitals taken for this visit.  Karnofsky/Lansky Performance Status:  100, Fully active, able to carry on all pre-disease performed without restriction (ECOG equivalent 0)  General/Constitutional: Well-appearing, NAD   HEENT: Normocephalic, atraumatic, no scleral icterus. Some hair loss at treatment site   Skin: No suspicious lesions or rashes  Pulmonary: No respiratory distress or increased work of breathing   Abdominal: Non distended  Musculoskeletal: Full range of motion in the extremities, without edema   Neurologic: Alert and oriented to conversation. CN II-XII intact. Strength 5/5 in all 4 extremities. Sensation normal in all 4 extremities.   Psychiatric: Appropriate affect and judgement. Some repetition of questions and lack of memory of prior events        I have personally reviewed daily imaging, which shows good alignment to the initial setup and plan.       Jorge Marble, MD, PhD  Assistant Professor  Department of Radiation Oncology  [facility]  [address], [id number]  [place], [place] [address]  O: [phone]  P: [phone]

## 2021-12-30 ENCOUNTER — Ambulatory Visit: Admit: 2021-12-30 | Discharge: 2021-12-31

## 2021-12-30 ENCOUNTER — Other Ambulatory Visit: Admit: 2021-12-30 | Discharge: 2021-12-31

## 2021-12-30 DIAGNOSIS — C719 Malignant neoplasm of brain, unspecified: Principal | ICD-10-CM

## 2021-12-30 LAB — CBC W/ AUTO DIFF
BASOPHILS ABSOLUTE COUNT: 0.1 10*9/L (ref 0.0–0.1)
BASOPHILS RELATIVE PERCENT: 1 %
EOSINOPHILS ABSOLUTE COUNT: 0.1 10*9/L (ref 0.0–0.5)
EOSINOPHILS RELATIVE PERCENT: 2 %
HEMATOCRIT: 41.2 % (ref 39.0–48.0)
HEMOGLOBIN: 14 g/dL (ref 12.9–16.5)
LYMPHOCYTES ABSOLUTE COUNT: 0.9 10*9/L — ABNORMAL LOW (ref 1.1–3.6)
LYMPHOCYTES RELATIVE PERCENT: 12 %
MEAN CORPUSCULAR HEMOGLOBIN CONC: 34 g/dL (ref 32.0–36.0)
MEAN CORPUSCULAR HEMOGLOBIN: 30.8 pg (ref 25.9–32.4)
MEAN CORPUSCULAR VOLUME: 90.7 fL (ref 77.6–95.7)
MEAN PLATELET VOLUME: 7.9 fL (ref 6.8–10.7)
MONOCYTES ABSOLUTE COUNT: 0.5 10*9/L (ref 0.3–0.8)
MONOCYTES RELATIVE PERCENT: 6.9 %
NEUTROPHILS ABSOLUTE COUNT: 5.7 10*9/L (ref 1.8–7.8)
NEUTROPHILS RELATIVE PERCENT: 78.1 %
PLATELET COUNT: 276 10*9/L (ref 150–450)
RED BLOOD CELL COUNT: 4.54 10*12/L (ref 4.26–5.60)
RED CELL DISTRIBUTION WIDTH: 13.7 % (ref 12.2–15.2)
WBC ADJUSTED: 7.3 10*9/L (ref 3.6–11.2)

## 2021-12-30 LAB — COMPREHENSIVE METABOLIC PANEL
ALBUMIN: 4.2 g/dL (ref 3.4–5.0)
ALKALINE PHOSPHATASE: 57 U/L (ref 46–116)
ALT (SGPT): 19 U/L (ref 10–49)
ANION GAP: 6 mmol/L (ref 5–14)
AST (SGOT): 11 U/L (ref <=34)
BILIRUBIN TOTAL: 0.4 mg/dL (ref 0.3–1.2)
BLOOD UREA NITROGEN: 22 mg/dL (ref 9–23)
BUN / CREAT RATIO: 25
CALCIUM: 9.7 mg/dL (ref 8.7–10.4)
CHLORIDE: 110 mmol/L — ABNORMAL HIGH (ref 98–107)
CO2: 28 mmol/L (ref 20.0–31.0)
CREATININE: 0.89 mg/dL
EGFR CKD-EPI (2021) MALE: >90 mL/min/{1.73_m2} (ref >=60)
GLUCOSE RANDOM: 101 mg/dL (ref 70–179)
POTASSIUM: 3.7 mmol/L (ref 3.4–4.8)
PROTEIN TOTAL: 7.5 g/dL (ref 5.7–8.2)
SODIUM: 144 mmol/L (ref 135–145)

## 2021-12-30 MED ORDER — DEXAMETHASONE 1 MG TABLET
ORAL_TABLET | 0 refills | 0 days | Status: CP
Start: 2021-12-30 — End: ?
  Filled 2021-12-30: qty 120, 42d supply, fill #0

## 2021-12-30 MED ADMIN — gadobenate dimeglumine (MULTIHANCE) 529 mg/mL (0.1mmol/0.2mL) solution 14 mL: 14 mL | INTRAVENOUS | @ 14:00:00 | Stop: 2021-12-30

## 2021-12-30 NOTE — Unmapped (Signed)
Complete  Your application for financial assistance has been submitted. Your application was approved for full coverage. Your out-of-pocket costs will be $0.  Thank you for giving [place] consent to access your medical records from your healthcare providers to support in our research efforts and improve patient outcomes.  If you have any questions please reach out to our Client Services team by calling [phone] Monday - Friday between 8:00 am and 5:00 pm CST.  Thanks,  [employer]
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
## 2021-12-30 NOTE — Unmapped (Signed)
PIV placed.  Labs drawn & sent for analysis. To next appt.  Care provided by Barbara Lella RN.

## 2021-12-31 NOTE — Unmapped (Signed)
Clinical Pharmacist Practitioner: Neuro Oncology Clinic    New Start Temozolomide/Bevacizumab Education    Jorge Contreras is a 41 y.o. 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     Temozolomide 150 mg/m2 5 days of every 28  Side effects discussed included but were not limited to: bone marrow suppression, nausea and vomiting, fatigue, and LFT abnormalities. Side effect prevention and management were also reviewed including laboratory monitoring of CBC and LFTs.    Administration: I reviewed the importance of taking the medication without food  (i.e. 1h before of 2h after a meal) with a full glass of water and at bedtime to reduce nausea. Oral chemotherapy handling precautions, pregnancy risk, as well as the importance of medication adherence were also discussed.    Drug Interactions: Instructed the patient that this medication has potential drug interactions. The patient should inform me of any new prescriptions so that I may evaluate for potential drug interactions.other medications reviewed and up to date in Epic.  No drug interactions identified.    Bevacizumab 5 mg/kg q2 weeks  Side effects discussed included but were not limited to: venous thromboembolism, bleeding, drug-induced hypertension, proteinuria, and wound healing complications. Side effect prevention, monitoring and management were reviewed.    Administration: Reviewed this would be administered through the vein every 2 weeks in our infusion center.     Drug Access  Patient will need to apply for full PAP benefit, they will meet with counselor today. I obtained MAP signature waiver in clinic today to sent to MAP team for bevacizumab manufacturer assistance.     Time with patient: 30 min    Laverna Peace PharmD, BCOP, CPP  Hematology/Oncology Pharmacist  P: [phone]

## 2022-01-02 MED ORDER — ONDANSETRON HCL 8 MG TABLET
ORAL_TABLET | 2 refills | 0 days | Status: CP
Start: 2022-01-02 — End: ?

## 2022-01-02 MED ORDER — TEMOZOLOMIDE 250 MG CAPSULE
ORAL_CAPSULE | Freq: Every day | ORAL | 0 refills | 5 days | Status: CP
Start: 2022-01-02 — End: 2022-01-30
  Filled 2022-01-02 (×2): qty 5, 28d supply, fill #0

## 2022-01-02 MED ORDER — ZZ IMS TEMPLATE
Freq: Every day | ORAL | 0 refills | 5 days | Status: CN
Start: 2022-01-02 — End: 2022-01-07

## 2022-01-02 MED ORDER — TEMOZOLOMIDE 20 MG CAPSULE
ORAL_CAPSULE | Freq: Every day | ORAL | 0 refills | 5 days | Status: CP
Start: 2022-01-02 — End: 2022-01-30

## 2022-01-02 NOTE — Unmapped (Signed)
[facility] Clinical Assessment & Refill Coordination Note    Jorge Parlato., DOB: 1981/08/14  Phone: [phone] (home)     All above HIPAA information was verified with patient's family member, fiance.     Was a Nurse, learning disability used for this call? No    Specialty Medication(s):   Hematology/Oncology: Temozolomide 20 mg and Temozolomide 250 mg     Current Outpatient Medications   Medication Sig Dispense Refill    dexAMETHasone (DECADRON) 1 MG tablet Take 8 tablets once daily in the morning for one week, then 4 tablets in the morning for one week, then 2 tablets in the morning for one week, then 1 tablet in the morning 120 tablet 0    famotidine (PEPCID) 20 MG tablet Take 1 tablet (20 mg total) by mouth Two (2) times a day. 60 tablet 0    levETIRAcetam (KEPPRA) 750 MG tablet Take 1 tablet (750 mg total) by mouth Two (2) times a day. (Patient not taking: Reported on 11/19/2021) 60 tablet 1    mometasone (ELOCON) 0.1 % cream Apply to affected area daily (Patient not taking: Reported on 12/30/2021) 45 g 1    naproxen sodium (ALEVE) 220 MG tablet Take 1 tablet (220 mg total) by mouth every twelve (12) hours as needed for pain. (Patient not taking: Reported on 12/30/2021)      ondansetron (ZOFRAN) 8 MG tablet Take one hour before chemotherapy and every 8 hours as needed for nausea 60 tablet 2    sulfamethoxazole-trimethoprim (BACTRIM DS) 800-160 mg per tablet Take 1 tablet (160 mg of trimethoprim total) by mouth Every Monday, Wednesday, and Friday. (Patient not taking: Reported on 11/19/2021) 18 tablet 0    temozolomide (TEMODAR) 20 mg capsule Take 1 capsule (20 mg total) by mouth daily for 5 days  with 1 other temozolomide prescription for 270 mg total. Take one hour after nausea medication 5 capsule 0    temozolomide (TEMODAR) 250 mg capsule Take 1 capsule (250 mg total) by mouth daily for 5 days  with 1 other temozolomide prescription for 270 mg total. Take one hour after nausea medication 5 capsule 0     No current facility-administered medications for this visit.        Changes to medications: Jorge Contreras reports starting the following medications: dexamethasone taper    Allergies   Allergen Reactions    Niacin Anaphylaxis and Swelling       Changes to allergies: No    SPECIALTY MEDICATION ADHERENCE     temozolomide 20 mg: 0 days of medicine on hand   temozolomide 250 mg: 0 days of medicine on hand     Medication Adherence    Patient reported X missed doses in the last month: 0  Specialty Medication: Temozolozide 250 mg and 20 mg - 270 mg daily x 5 days of each 28 day cycle  Patient is on additional specialty medications: No  Informant: significant other  Confirmed plan for next specialty medication refill: delivery by pharmacy  Refills needed for supportive medications: not needed          Specialty medication(s) dose(s) confirmed:  Completed chemorads.  Starting Adjuvant temozolomide C1D1 planned 01/02/22      Are there any concerns with adherence? No    Adherence counseling provided? Not needed    CLINICAL MANAGEMENT AND INTERVENTION      Clinical Benefit Assessment:    Do you feel the medicine is effective or helping your condition? Yes  Clinical Benefit counseling provided? Not needed    Adverse Effects Assessment:    Are you experiencing any side effects? No - NA starting maintenance cycle    Are you experiencing difficulty administering your medicine? No    Quality of Life Assessment:    Quality of Life    Rheumatology  Oncology  Dermatology  Cystic Fibrosis          How many days over the past month did your astrocytoma  keep you from your normal activities? For example, brushing your teeth or getting up in the morning. 0    Have you discussed this with your provider? Yes    Acute Infection Status:    Acute infections noted within Epic:  No active infections  Patient reported infection: None    Therapy Appropriateness:    Is therapy appropriate and patient progressing towards therapeutic goals? Yes, therapy is appropriate and should be continued    DISEASE/MEDICATION-SPECIFIC INFORMATION      N/A    PATIENT SPECIFIC NEEDS     Does the patient have any physical, cognitive, or cultural barriers? No    Is the patient high risk? Yes, patient is taking oral chemotherapy. Appropriateness of therapy as been assessed    Does the patient require a Care Management Plan? No     SOCIAL DETERMINANTS OF HEALTH     At the [facility], we have learned that life circumstances - like trouble affording food, housing, utilities, or transportation can affect the health of many of our patients.   That is why we wanted to ask: are you currently experiencing any life circumstances that are negatively impacting your health and/or quality of life? Patient declined to answer (did not discuss)    Social Determinants of Health     Financial Resource Strain: Not on file   Internet Connectivity: Not on file   Food Insecurity: Not on file   Tobacco Use: High Risk    Smoking Tobacco Use: Every Day    Smokeless Tobacco Use: Never    Passive Exposure: Not on file   Housing/Utilities: Not on file   Alcohol Use: Not on file   Transportation Needs: Not on file   Substance Use: Not on file   Health Literacy: Not on file   Physical Activity: Not on file   Interpersonal Safety: Not on file   Stress: Not on file   Intimate Partner Violence: Not on file   Depression: Not on file   Social Connections: Not on file       Would you be willing to receive help with any of the needs that you have identified today? Not applicable       SHIPPING     Specialty Medication(s) to be Shipped:   Hematology/Oncology: Temozolomide 20 mg and Temozolomide 250 mg    Other medication(s) to be shipped: No additional medications requested for fill at this time     Changes to insurance: No    Delivery Scheduled: Yes, Expected medication delivery date: 01/02/22.     Medication will be delivered via Same Day Courier to the confirmed prescription address in [facility].    The patient will receive a drug information handout for each medication shipped and additional FDA Medication Guides as required.  Verified that patient has previously received a Conservation officer, historic buildings and a Surveyor, mining.    The patient or caregiver noted above participated in the development of this care plan and knows that they can request  review of or adjustments to the care plan at any time.      All of the patient's questions and concerns have been addressed.    Breck Coons [facility] Specialty Pharmacist

## 2022-01-19 ENCOUNTER — Ambulatory Visit: Admit: 2022-01-19 | Discharge: 2022-01-19

## 2022-01-19 LAB — CBC W/ AUTO DIFF
BASOPHILS ABSOLUTE COUNT: 0.1 10*9/L (ref 0.0–0.1)
BASOPHILS RELATIVE PERCENT: 0.8 %
EOSINOPHILS ABSOLUTE COUNT: 0 10*9/L (ref 0.0–0.5)
EOSINOPHILS RELATIVE PERCENT: 0.2 %
HEMATOCRIT: 43.8 % (ref 39.0–48.0)
HEMOGLOBIN: 15.3 g/dL (ref 12.9–16.5)
LYMPHOCYTES ABSOLUTE COUNT: 0.9 10*9/L — ABNORMAL LOW (ref 1.1–3.6)
LYMPHOCYTES RELATIVE PERCENT: 6.6 %
MEAN CORPUSCULAR HEMOGLOBIN CONC: 34.9 g/dL (ref 32.0–36.0)
MEAN CORPUSCULAR HEMOGLOBIN: 31.8 pg (ref 25.9–32.4)
MEAN CORPUSCULAR VOLUME: 91.1 fL (ref 77.6–95.7)
MONOCYTES ABSOLUTE COUNT: 0.3 10*9/L (ref 0.3–0.8)
MONOCYTES RELATIVE PERCENT: 2.6 %
NEUTROPHILS ABSOLUTE COUNT: 12 10*9/L — ABNORMAL HIGH (ref 1.8–7.8)
NEUTROPHILS RELATIVE PERCENT: 89.8 %
PLATELET COUNT: >302 10*9/L (ref 150–450)
RED BLOOD CELL COUNT: 4.81 10*12/L (ref 4.26–5.60)
RED CELL DISTRIBUTION WIDTH: 13.9 % (ref 12.2–15.2)
WBC ADJUSTED: 13.3 10*9/L — ABNORMAL HIGH (ref 3.6–11.2)

## 2022-01-19 LAB — SLIDE REVIEW

## 2022-01-19 MED ORDER — PROCHLORPERAZINE MALEATE 10 MG TABLET
ORAL_TABLET | Freq: Four times a day (QID) | ORAL | 2 refills | 8.00000 days | Status: CP | PRN
Start: 2022-01-19 — End: ?

## 2022-01-20 NOTE — Unmapped (Signed)
LV with call back number.

## 2022-01-20 NOTE — Unmapped (Signed)
Spoke with patient and girlfriend in infusion.    I apologized for the incontinence, but we are not able to give bev today.  It still needs to be authorized.      I can give them more information tomorrow on rescheduling patient.  Jorge Contreras voiced disappointment but was thankful for the information.  Jorge Contreras is able to come back for infusion anytime, but they ask for as much notice as possible.  They are dependent on others for a ride.  I will give them updates as soon as possible.

## 2022-01-21 NOTE — Unmapped (Signed)
LV for patient's sister.    Giving her a call back.  We received her voicemail from care management.    Left call back number for her to reach out and discuss patient's care.

## 2022-01-26 NOTE — Unmapped (Signed)
RADIATION ONCOLOGY TREATMENT COMPLETION NOTE    Encounter Date: 12/03/2021  Patient Name: Jorge Contreras.  Medical Record Number: [id number]    Referring Physician: No referring provider defined for this encounter.    Primary Care Provider: [facility]    DIAGNOSIS:  41 y.o. male with PMH of right frontal lobe Grade II astrocytoma s/p resection 06/2018 but lost to followup. Now with progressive recurrence at the right frontal lobe s/p resection 09/18/2021 with pathology demonstrating WHO grade 4 astrocytoma, IDH-WT, other molecular pathology results pending    TREATMENT INTENT: long term control    CLINICAL TRIAL: no    CHEMOTHERAPY: administered concurrently    RADIATION TREATMENT SUMMARY:          Treatment site Treatment Technique/Modality  Dose per fraction Total number  of fractions Total dose Start date End date   Right frontal lobe IMRT   200 cGy 30 6000 cGy 10/23/21   12/03/21                                   COMPLETED INTENDED COURSE:  yes    TREATMENT BREAK > 2 WEEKS:  no    TOLERANCE TO TREATMENT:  Mild toxicities or complications not requiring treatment    PLAN FOR FOLLOW-UP: Rikki Spearing. is to return for follow up with neuro oncology in about 4 weeks with new MRI brain. We will be available for patient as needed into the future.        Sydnee Levans, PA-C  Department of Radiation Oncology  [facility]  [id number]  [place], [place] [address]  O: [phone]  01/26/22 11:32 AM

## 2022-01-27 NOTE — Unmapped (Signed)
Spoke with [place].    I explained that we have not obtained authorization yet but we are hopeful it will be given by the end of the week.    We reviewed appointments for Monday.  She understands he may or may not get bev pending authorization.    Elijio took his last dexamethasone pill today.  Dr. Reather Converse assigned Renae Fickle a taper starting 5/16.  I explained to [place] that if Orlan is too fatigued through the day or not eating, having headaches, gait changes, or vision problems she should call us right away to discuss.      Otherwise [place] confirmed Kohle will see [place] Monday for next TMZ cycle.

## 2022-01-29 LAB — CBC W/ AUTO DIFF
BASOPHILS ABSOLUTE COUNT: 0.1 10*9/L (ref 0.0–0.1)
BASOPHILS RELATIVE PERCENT: 0.8 %
EOSINOPHILS ABSOLUTE COUNT: 0.1 10*9/L (ref 0.0–0.5)
EOSINOPHILS RELATIVE PERCENT: 0.8 %
HEMATOCRIT: 38.1 % — ABNORMAL LOW (ref 39.0–48.0)
HEMOGLOBIN: 13.2 g/dL (ref 12.9–16.5)
LYMPHOCYTES ABSOLUTE COUNT: 0.8 10*9/L — ABNORMAL LOW (ref 1.1–3.6)
LYMPHOCYTES RELATIVE PERCENT: 10.2 %
MEAN CORPUSCULAR HEMOGLOBIN CONC: 34.8 g/dL (ref 32.0–36.0)
MEAN CORPUSCULAR HEMOGLOBIN: 31.1 pg (ref 25.9–32.4)
MEAN CORPUSCULAR VOLUME: 89.3 fL (ref 77.6–95.7)
MEAN PLATELET VOLUME: 6.7 fL — ABNORMAL LOW (ref 6.8–10.7)
MONOCYTES ABSOLUTE COUNT: 0.6 10*9/L (ref 0.3–0.8)
MONOCYTES RELATIVE PERCENT: 8.1 %
NEUTROPHILS ABSOLUTE COUNT: 6.4 10*9/L (ref 1.8–7.8)
NEUTROPHILS RELATIVE PERCENT: 80.1 %
PLATELET COUNT: 335 10*9/L (ref 150–450)
RED BLOOD CELL COUNT: 4.26 10*12/L (ref 4.26–5.60)
RED CELL DISTRIBUTION WIDTH: 13.4 % (ref 12.2–15.2)
WBC ADJUSTED: 8 10*9/L (ref 3.6–11.2)

## 2022-01-29 LAB — COMPREHENSIVE METABOLIC PANEL
ALBUMIN: 3.7 g/dL (ref 3.4–5.0)
ALKALINE PHOSPHATASE: 62 U/L (ref 46–116)
ALT (SGPT): 15 U/L (ref 10–49)
ANION GAP: 10 mmol/L (ref 5–14)
AST (SGOT): 15 U/L (ref <=34)
BILIRUBIN TOTAL: 0.6 mg/dL (ref 0.3–1.2)
BLOOD UREA NITROGEN: 8 mg/dL — ABNORMAL LOW (ref 9–23)
BUN / CREAT RATIO: 13
CALCIUM: 8.5 mg/dL — ABNORMAL LOW (ref 8.7–10.4)
CHLORIDE: 105 mmol/L (ref 98–107)
CO2: 24 mmol/L (ref 20.0–31.0)
CREATININE: 0.62 mg/dL
EGFR CKD-EPI (2021) MALE: >90 mL/min/{1.73_m2} (ref >=60)
GLUCOSE RANDOM: 85 mg/dL (ref 70–179)
POTASSIUM: 3.4 mmol/L (ref 3.4–4.8)
PROTEIN TOTAL: 6.6 g/dL (ref 5.7–8.2)
SODIUM: 139 mmol/L (ref 135–145)

## 2022-01-29 LAB — LACTATE, VENOUS, WHOLE BLOOD: LACTATE BLOOD VENOUS: 1.2 mmol/L (ref 0.5–1.8)

## 2022-01-29 NOTE — Unmapped (Signed)
Spoke with [place].    Let her know that infusion for Monday is approved and we will see him in clinic before the bev appointemnt.

## 2022-01-30 ENCOUNTER — Ambulatory Visit: Admit: 2022-01-30 | Discharge: 2022-02-01 | Disposition: A | Discharge status: Home Health | Payer: MEDICAID

## 2022-01-30 LAB — PROTIME-INR
INR: 1.19
PROTIME: 13.6 s — ABNORMAL HIGH (ref 9.8–12.8)

## 2022-01-30 LAB — BLOOD GAS, VENOUS
BASE EXCESS VENOUS: 1.9 (ref -2.0–2.0)
HCO3 VENOUS: 27 mmol/L (ref 22–27)
O2 SATURATION VENOUS: 83.7 % (ref 40.0–85.0)
PCO2 VENOUS: 40 mmHg (ref 40–60)
PH VENOUS: 7.43 (ref 7.32–7.43)
PO2 VENOUS: 46 mmHg (ref 30–55)

## 2022-01-30 LAB — HIGH SENSITIVITY TROPONIN I - 2H/6H SERIAL
HIGH SENSITIVITY TROPONIN - DELTA (0-2H): 0 ng/L (ref <=7)
HIGH-SENSITIVITY TROPONIN I - 2 HOUR: 3 ng/L (ref <=53)

## 2022-01-30 LAB — HIGH SENSITIVITY TROPONIN I - SINGLE: HIGH SENSITIVITY TROPONIN I: 3 ng/L (ref <=53)

## 2022-01-30 LAB — MAGNESIUM: MAGNESIUM: 1.9 mg/dL (ref 1.6–2.6)

## 2022-01-30 MED ADMIN — dexamethasone (DECADRON) 4 mg/mL injection 4 mg: 4 mg | INTRAVENOUS | @ 11:00:00

## 2022-01-30 MED ADMIN — levETIRAcetam (KEPPRA) injection 750 mg: 750 mg | INTRAVENOUS | @ 13:00:00

## 2022-01-30 MED ADMIN — pantoprazole (PROTONIX) injection 40 mg: 40 mg | INTRAVENOUS | @ 13:00:00 | Stop: 2022-01-30

## 2022-01-30 MED ADMIN — gadobenate dimeglumine (MULTIHANCE) 529 mg/mL (0.1mmol/0.2mL) solution 14 mL: 14 mL | INTRAVENOUS | @ 15:00:00 | Stop: 2022-01-30

## 2022-01-30 MED ADMIN — dexamethasone (DECADRON) 4 mg/mL injection 4 mg: 4 mg | INTRAVENOUS | @ 19:00:00

## 2022-01-30 MED ADMIN — levETIRAcetam (KEPPRA) 4,200 mg in sodium chloride (NS) 0.9 % 100 mL IVPB: 4200 mg | INTRAVENOUS | @ 06:00:00 | Stop: 2022-01-30

## 2022-01-30 MED ADMIN — dexamethasone (DECADRON) 4 mg/mL injection 10 mg: 10 mg | INTRAVENOUS | @ 06:00:00 | Stop: 2022-01-30

## 2022-01-30 MED ADMIN — enoxaparin (LOVENOX) syringe 30 mg: 30 mg | SUBCUTANEOUS | @ 13:00:00

## 2022-01-30 NOTE — Unmapped (Signed)
BIB EMS from home c/o seizure, 15 seconds
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
## 2022-01-30 NOTE — Unmapped (Signed)
ADULT SPECIALTY CARE TEAM  Transport Summary Note     Departing Unit: [facility] Departure Time: 1020   Unit Returned To: [facility] Return Time: 1125           Report received from primary nurse via [facility]AR[facility]. Patient prepared to transport to MRI via stretcher under ICU Transport Protocol. Vital signs during transport, see vital signs section of DocFlowsheet for further details. Patient is unresponsive. O2 via  room air . Patient tolerated procedure well; patient remained bradycardic throughout MRI. Universal precautions maintained throughout transport.     Returned to [employer], update and care given to primary nurse. See Doc Flowsheet for additional transport documentation.
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
## 2022-01-30 NOTE — Unmapped (Signed)
[facility]  Emergency Department Medical Screening Examination     Subjective     Jorge Contreras. is a 41 y.o. male with PMH of brain cancer presenting after a seizure.  Has had 1 prior seizure.  Is supposed to be on Keppra but unclear if he is taking it.  Vital signs and blood glucose normal for EMS save for sinus bradycardia of 50 bpm which is baseline for patient per girlfriend.     Abbreviated Review of Systems/Covid Screen  Constitutional: Negative for fever  Respiratory: Negative for cough. Negative for difficulty breathing.    Objective       Focused Physical Exam  Constitutional: No acute distress.  Respiratory: Non-labored respirations.  Neurological: Nonverbal which is patient's baseline, globally weak but able to move all extremities.    ?  Assessment & Plan   Will obtain CT head without contrast, CBC, CMP, EKG, chest x-ray, lactic acid.  Patient will be roomed soon as possible but no rooms currently available.    The remainder of the assessment and management of the patient will be deferred to the ED provider who performs a comprehensive evaluation once an appropriate treatment space becomes available. They will follow-up of any testing ordered from triage.    A medical screening exam has been performed. At the time of this evaluation, no emergency medical condition requiring immediate stabilization has been identified nor is there suspicion for imminent decompensation. Appropriate triage protocols will be implemented and a comprehensive ED evaluation with disposition will be completed by a healthcare provider when an appropriate treatment space becomes available. The patient has been made aware that this is an initial encounter only and verbalizes understanding and agreement with the plan.     Emergency Department operations continue to be impacted by the COVID-19 pandemic.     Luther Redo, MD  January 29, 2022 8:26 PM

## 2022-01-30 NOTE — Unmapped (Signed)
Patient initially minimally responsive to stimuli and verbal commands. Patient now A&Ox2, follows commands. Afebrile. Patient has been Sinus Huston Foley down to mid 40s to normal sinus. Normotensive. Family has been at bedside most of the day. On room air. MRI Brain completed. Condom cath in place. LBM PTA. Skin intact. Patient now tolerating fluids.  Family meeting was held between palliative care and patient's siblings and fiance.     Problem: Adult Inpatient Plan of Care  Goal: Rounds/Family Conference  Outcome: Progressing     Problem: Fall Injury Risk  Goal: Absence of Fall and Fall-Related Injury  01/30/2022 1656 by Oneal Deputy, RN  Outcome: Progressing  01/30/2022 1655 by Oneal Deputy, RN  Outcome: Progressing  Intervention: Promote Injury-Free Environment  Recent Flowsheet Documentation  Taken 01/30/2022 1200 by Oneal Deputy, RN  Safety Interventions:   aspiration precautions   bed alarm   low bed  Taken 01/30/2022 1000 by Oneal Deputy, RN  Safety Interventions:   aspiration precautions   bleeding precautions   family at bedside  Taken 01/30/2022 0805 by Oneal Deputy, RN  Safety Interventions:   aspiration precautions   bed alarm     Problem: Skin Injury Risk Increased  Goal: Skin Health and Integrity  01/30/2022 1656 by Oneal Deputy, RN  Outcome: Progressing  01/30/2022 1655 by Oneal Deputy, RN  Outcome: Progressing  Intervention: Optimize Skin Protection  Recent Flowsheet Documentation  Taken 01/30/2022 1400 by Oneal Deputy, RN  Pressure Reduction Techniques: heels elevated off bed  Skin Protection: adhesive use limited  Taken 01/30/2022 1200 by Oneal Deputy, RN  Pressure Reduction Techniques:   heels elevated off bed   positioned off wounds  Head of Bed (HOB) Positioning: HOB at 30-45 degrees  Pressure Reduction Devices:   feet on footrest/footstool   pressure-redistributing mattress utilized  Skin Protection: adhesive use limited  Taken 01/30/2022 1000 by Oneal Deputy, RN  Pressure Reduction Techniques: heels elevated off bed  Head of Bed (HOB) Positioning: HOB at 30-45 degrees  Pressure Reduction Devices:   feet on footrest/footstool   heel offloading device utilized  Skin Protection: adhesive use limited  Taken 01/30/2022 0805 by Oneal Deputy, RN  Pressure Reduction Techniques:   heels elevated off bed   positioned off wounds   pressure points protected  Pressure Reduction Devices:   feet on footrest/footstool   positioning supports utilized   pressure-redistributing mattress utilized  Skin Protection:   adhesive use limited   incontinence pads utilized   silicone foam dressing in place   tubing/devices free from skin contact
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
## 2022-01-30 NOTE — Unmapped (Signed)
ED Procedure Note    Critical Care  Performed by: Penni Bombard, MD  Authorized by: Penni Bombard, MD     Critical care provider statement:     Critical care time (minutes):  47    Critical care time was exclusive of:  Separately billable procedures and treating other patients and teaching time    Critical care was necessary to treat or prevent imminent or life-threatening deterioration of the following conditions:  CNS failure or compromise    Critical care was time spent personally by me on the following activities:  Discussions with consultants, examination of patient, ordering and review of radiographic studies, ordering and performing treatments and interventions, pulse oximetry, re-evaluation of patient's condition and review of old charts    I assumed direction of critical care for this patient from another provider in my specialty: no
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
## 2022-01-30 NOTE — Unmapped (Addendum)
Outpatient provider follow-up issues:    Medication Changes:    Follow-up:    Procedures this hospitalization:  {discharge procedures GEXB:28413}    Family Involved:      Brief summary:      Hospital Course by Problem List Below:      Patient with known astrocytoma WHO grade 4, IDH mutated.  Who presents to the emergency department after a gradual decline in mental status over the past several days as well as concern for seizure-like activity involving rhythmic shaking in bilateral lower extremities and right upper extremity.  CT head was obtained which shows a large bifrontal mass with extensive vasogenic edema as well as a right frontal fluid collection.  He does have a prior seizure history and is supposed to be on ASM per last neuro-oncology note.  However patient is not on Keppra per the fiancee.  Given his depressed mental status with GCS 6 and seizure semiology consistent where his brain malignancy is located, recommend doing a Keppra load at this time and hooking patient up to continuous video EEG to assess for NCSE.   Recommendations:  - Agree with neurosurgery consult   - Load with 60 mg/kg of Keppra now and start maintenance 750 mg BID   - STAT continuous video EEG    Neurosurgery recommendations  - recommend admission to oncology/medicine for failure to thrive  - agree with neurology consult for seizure management  - recommend dexamethasone 4 mg Q6 for cerebral edema  - recommend repeat MRI Brain w/wo contrast given progression of symptoms  - plan for multidisciplinary conversation around prognosis, treatment plan, and possible surgical intervention    ?Radiation Oncology
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
## 2022-01-30 NOTE — Unmapped (Signed)
FINAL??LONGTERM VIDEO EEG MONITORING REPORT    Identifying Information   NAME: Jorge Contreras.    MRN: [id number]   DOB: 1981/07/15    LOC: 2731/2731-01    HISTORY: 42 y.o. male with history of known astrocytoma WHO grade 4, IDH mutated and seizures, presented to the ED with AMS and seizure-like activity involving rhythmic shaking in bilateral lower extremities and right upper extremity. Eval for seizures.     INDICATION:  Evaluate for Electrographic Seizures, Seizures , Encephalopathy and Brain Tumor       Study Information  EEG Start: January 30, 2022 at 0418  EEG End: January 30, 2022 at 1248  Of Note: The study was disconnected from 10:18 to 11:37 on 01/30/22  Duration of Study: 7 hours    EEG TECHNICAL DESCRIPTION   Conditions of Recording:  Continuous EEG with simultaneous video recording was performed utilizing 21 active electrodes placed according to the international 10-20 system.  The study was recorded digitally with a bandpass of 1-70Hz  and a sampling rate of 200Hz  and was reviewed with the possibility of multiple reformatting.  The study was digitally processed with potential spike and seizure events identified for physician analysis and review.  Patient recognized events were identified by a push button marker and reviewed by the physician.   Simultaneous video was reviewed for all patient events.    EEG DESCRIPTION    Relevant Medications: Keppra load, then 750 mg BID    Background Activity     Frequency (maximal in waking):  There is a mixture of 3-4 hz delta and 5-6 hz theta with overriding beta frequencies. There is higher amplitude faster frequencies over the right frontocentral regions.    PDR:  Absent     Asymmetry/ Focal slowing: Yes, see above     Amplitude: Normal (20 - 150 uV)    Continuity:  Continuous    Sleep Activity/ State Change: State change present without N2 sleep     Reactivity: Present     Periodic/ Rhythmic Patterns/ Epileptiform Activity:  No.    Seizures: No.    Patient Events: No.    FINAL INTERPRETATION:   Abnormal EEG due to:  - Moderate diffuse background (theta/delta) slowing  - Higher amplitude faster frequencies over the right frontocentral regions, consistent with breach rhythm     CLINICAL CORRELATION / SUMMARY   These findings are consistent with moderate encephalopathy, which is a non-specific indicator of diffuse cerebral dysfunction as seen in delirium, metabolic derangement, toxicity, or other types of diffuse encephalopathy. There is also a right frontal breach rhythm, consistent with patient's known right frontal skull defect.   No definitive epileptiform discharges or seizures were seen.    This study was read concurrently with Salina April,  M.D. who agrees with the above interpretation.    Radford Pax, M.D.  Epilepsy Fellow      Attending Attestation  I concurrently reviewed this EEG with the epilepsy fellow Radford Pax, M.D.  I reviewed the fellow???s note and made changes as needed. I agree with the findings and interpretation.     Salina April, MD  Attending Epileptologist

## 2022-01-31 LAB — BASIC METABOLIC PANEL
ANION GAP: 10 mmol/L (ref 5–14)
BLOOD UREA NITROGEN: 10 mg/dL (ref 9–23)
BUN / CREAT RATIO: 19
CALCIUM: 9.7 mg/dL (ref 8.7–10.4)
CHLORIDE: 106 mmol/L (ref 98–107)
CO2: 22 mmol/L (ref 20.0–31.0)
CREATININE: 0.52 mg/dL — ABNORMAL LOW
EGFR CKD-EPI (2021) MALE: >90 mL/min/{1.73_m2} (ref >=60)
GLUCOSE RANDOM: 97 mg/dL (ref 70–179)
POTASSIUM: 5.1 mmol/L — ABNORMAL HIGH (ref 3.4–4.8)
SODIUM: 138 mmol/L (ref 135–145)

## 2022-01-31 LAB — CBC W/ AUTO DIFF
BASOPHILS ABSOLUTE COUNT: 0.1 10*9/L (ref 0.0–0.1)
BASOPHILS RELATIVE PERCENT: 0.7 %
EOSINOPHILS ABSOLUTE COUNT: 0 10*9/L (ref 0.0–0.5)
EOSINOPHILS RELATIVE PERCENT: 0.4 %
HEMATOCRIT: 38.6 % — ABNORMAL LOW (ref 39.0–48.0)
HEMOGLOBIN: 13.6 g/dL (ref 12.9–16.5)
LYMPHOCYTES ABSOLUTE COUNT: 1.1 10*9/L (ref 1.1–3.6)
LYMPHOCYTES RELATIVE PERCENT: 14.2 %
MEAN CORPUSCULAR HEMOGLOBIN CONC: 35.2 g/dL (ref 32.0–36.0)
MEAN CORPUSCULAR HEMOGLOBIN: 31.7 pg (ref 25.9–32.4)
MEAN CORPUSCULAR VOLUME: 90.1 fL (ref 77.6–95.7)
MEAN PLATELET VOLUME: 7 fL (ref 6.8–10.7)
MONOCYTES ABSOLUTE COUNT: 0.6 10*9/L (ref 0.3–0.8)
MONOCYTES RELATIVE PERCENT: 8 %
NEUTROPHILS ABSOLUTE COUNT: 5.8 10*9/L (ref 1.8–7.8)
NEUTROPHILS RELATIVE PERCENT: 76.7 %
PLATELET COUNT: 285 10*9/L (ref 150–450)
RED BLOOD CELL COUNT: 4.28 10*12/L (ref 4.26–5.60)
RED CELL DISTRIBUTION WIDTH: 13.6 % (ref 12.2–15.2)
WBC ADJUSTED: 7.5 10*9/L (ref 3.6–11.2)

## 2022-01-31 MED ADMIN — levETIRAcetam (KEPPRA) injection 750 mg: 750 mg | INTRAVENOUS | @ 15:00:00

## 2022-01-31 MED ADMIN — enoxaparin (LOVENOX) syringe 30 mg: 30 mg | SUBCUTANEOUS | @ 13:00:00

## 2022-01-31 MED ADMIN — dexamethasone (DECADRON) 4 mg/mL injection 4 mg: 4 mg | INTRAVENOUS | @ 20:00:00

## 2022-01-31 MED ADMIN — dexamethasone (DECADRON) 4 mg/mL injection 4 mg: 4 mg | INTRAVENOUS | @ 13:00:00

## 2022-01-31 MED ADMIN — levETIRAcetam (KEPPRA) injection 750 mg: 750 mg | INTRAVENOUS | @ 01:00:00

## 2022-01-31 MED ADMIN — dexamethasone (DECADRON) 4 mg/mL injection 4 mg: 4 mg | INTRAVENOUS | @ 07:00:00

## 2022-01-31 NOTE — Unmapped (Addendum)
Pt admitted to [facility] following AMS & unwitnessed seizures. Pt drowsy, responded to verbal stimuli, oriented to self, place & time. Denied having pain. Incontinent of urine, condom cath replaced with good UOP. Pt tolerated clear liquid diet, able to feed self, minimal intake, denied N/V. Family at bedside & updated. SCDs in place. Q2h turns. Bath given. Report given to Gabriel Rung, RN from [facility]. Pt transferred to [facility] with family and all belongings.     Problem: Adult Inpatient Plan of Care  Goal: Plan of Care Review  Outcome: Ongoing - Unchanged  Flowsheets (Taken 01/31/2022 1416)  Progress: improving  Plan of Care Reviewed With: patient  Goal: Patient-Specific Goal (Individualized)  Outcome: Ongoing - Unchanged  Flowsheets (Taken 01/31/2022 1416)  Patient-Specific Goals (Include Timeframe): Pt's HR will be maintained within WNL during this shift  Individualized Care Needs: Q2h reposition, Fall & Seizure precaution  Goal: Absence of Hospital-Acquired Illness or Injury  Outcome: Ongoing - Unchanged  Intervention: Identify and Manage Fall Risk  Recent Flowsheet Documentation  Taken 01/31/2022 0800 by Wilhemina Bonito, RN  Safety Interventions:   aspiration precautions   bed alarm   fall reduction program maintained   family at bedside   lighting adjusted for tasks/safety   low bed   nonskid shoes/slippers when out of bed  Intervention: Prevent Skin Injury  Recent Flowsheet Documentation  Taken 01/31/2022 0800 by Wilhemina Bonito, RN  Skin Protection:   adhesive use limited   incontinence pads utilized   silicone foam dressing in place  Intervention: Prevent and Manage VTE (Venous Thromboembolism) Risk  Recent Flowsheet Documentation  Taken 01/31/2022 1400 by Wilhemina Bonito, RN  Activity Management: activity adjusted per tolerance  Taken 01/31/2022 1200 by Wilhemina Bonito, RN  Activity Management: activity adjusted per tolerance  Taken 01/31/2022 1000 by Wilhemina Bonito, RN  Activity Management: activity adjusted per tolerance  Taken 01/31/2022 0800 by Wilhemina Bonito, RN  Activity Management: activity adjusted per tolerance  Goal: Optimal Comfort and Wellbeing  Outcome: Ongoing - Unchanged  Goal: Readiness for Transition of Care  Outcome: Ongoing - Unchanged  Goal: Rounds/Family Conference  Outcome: Ongoing - Unchanged  Flowsheets (Taken 01/31/2022 1416)  Participants:   family   patient   nursing   physician     Problem: Fall Injury Risk  Goal: Absence of Fall and Fall-Related Injury  Outcome: Ongoing - Unchanged  Intervention: Promote Injury-Free Environment  Recent Flowsheet Documentation  Taken 01/31/2022 0800 by Wilhemina Bonito, RN  Safety Interventions:   aspiration precautions   bed alarm   fall reduction program maintained   family at bedside   lighting adjusted for tasks/safety   low bed   nonskid shoes/slippers when out of bed     Problem: Skin Injury Risk Increased  Goal: Skin Health and Integrity  Outcome: Ongoing - Unchanged  Intervention: Optimize Skin Protection  Recent Flowsheet Documentation  Taken 01/31/2022 1400 by Wilhemina Bonito, RN  Head of Bed [facility]) Positioning: HOB lowered  Taken 01/31/2022 1200 by Wilhemina Bonito, RN  Head of Bed [facility]) Positioning: HOB lowered  Taken 01/31/2022 1000 by Wilhemina Bonito, RN  Head of Bed [facility]) Positioning: HOB lowered  Taken 01/31/2022 0800 by Wilhemina Bonito, RN  Pressure Reduction Techniques:   frequent weight shift encouraged   weight shift assistance provided  Head of Bed (HOB) Positioning: HOB elevated  Pressure Reduction Devices: positioning supports utilized  Skin Protection:   adhesive use limited   incontinence pads  utilized   silicone foam dressing in place     Problem: Self-Care Deficit  Goal: Improved Ability to Complete Activities of Daily Living  Outcome: Ongoing - Unchanged     Problem: Impaired Wound Healing  Goal: Optimal Wound Healing  Outcome: Ongoing - Unchanged  Intervention: Promote Wound Healing  Recent Flowsheet Documentation  Taken 01/31/2022 1400 by Wilhemina Bonito, RN  Activity Management: activity adjusted per tolerance  Taken 01/31/2022 1200 by Wilhemina Bonito, RN  Activity Management: activity adjusted per tolerance  Taken 01/31/2022 1000 by Wilhemina Bonito, RN  Activity Management: activity adjusted per tolerance  Taken 01/31/2022 0800 by Wilhemina Bonito, RN  Activity Management: activity adjusted per tolerance

## 2022-01-31 NOTE — Unmapped (Signed)
Neurology Inpatient Team B (NMB)  Daily Progress Note       Patient: Jorge Contreras.  Code Status: Full Code  Level of Care: Step-down->Acute floor status.   LOS: 1 day         Overnight Events & Subjective:     - No acute events overnight.  - Asymptomatic bradycardia to 40s this am. EKG confirmed sinus bradycardia.  - Mental status improved to recent baseline per fiance this morning.     Physical Exam:     General Appearance:Chronically ill appearing.   HEENT: Postcraniotomy scar.Sclera anicteric without injection. Oropharyngeal membranes are moist with no erythema or exudate.  Lungs: Normal work of breathing.    Heart: Bradycardic.  Abdomen: Nondistended.  Extremities: No clubbing, cyanosis, or edema.     Neurological Examination:      Mental Status: Arouses to verbal stimulus. Able to answer simple questions with delay. Oriented to self and place, not time or situation. Follows simple commands.     Cranial Nerves:   Pupils are equal and reactive.  EOMI.  Face is symmetric at rest and with activation.     Motor Exam:   Globally decreased bulk.  Normal tone.  Spontaneous and at least 3/5 throughout.     Reflexes: Deferred, previously: DTRs R/L: biceps 2+/2+, brachioradialis 2+/2+, patella 2+/2+, and ankle jerk 2+/2+. Slight fanning upwards of toes bilaterally.  5-6 beats of clonus in the left ankle.  2-3 beats of clonus in the right ankle.     Sensory: Intact to LT in bilateral upper and lower extremities.      Cerebellar/Coordination/Gait: Did not assess.     Assessment/Plan:       Assessment:Jorge Contreras. is a 41 y.o. 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 Most recent MRI (12/30/2021) showed significant disease burden and extension into the left frontal lobe. Follows with Dr. Reather Converse, of [facility] neurology. (per note on 5/16: planing to start Avastin and adjuvant tmz and continuing Keppra 750mg  BID).  Noncompliant with Keppra.  Patient's fianc?? reports decrease in cognition and ambulation since Monday, as well as several episodes of brief (3 to 5 seconds) shaking of both legs and right arm.  Was loaded with Keppra in the ED and started on maintenance Keppra.  EEG showed higher amplitude faster frequencies over the right frontocentral regions, consistent with breach rhythm, but no seizures or epileptiform activity. Repeat MRI brain w wo contrast 6/16 demonstrated increased vasogenic edema in bilateral frontal lobes. And approximately 1.3 cm of left to right midline shift. Now back to baseline mental status since 6/17.   -Continue Keppra 750 mg twice daily  -Neurosurgery following, recs appreciated  -Continue dexamethasone 4 mg every 6 hours per neurosurgery  -Oncology aware of admission  -Radiation oncology consulted, no radiation offered  -Palliative care consulted  -Notified Dr. Reather Converse of admission, see consult note  - Considering Avastin while admitted per Dr. Reather Converse      # Discharge Planning:   - Case management: consulted. Recommendations appreciated.  - Social work: N/A  - PT: consulted, TBD  - OT:  consulted, TBD  - SLP: N/A  - PM&R: N/A  - Expected Discharge Disposition: TBD.      # Checklist:  - Diet: Enteral nutrition via NG or G-tube  - IV fluids: no  - Bowel Regimen: No indication for a bowel regimen at this time (reason: not constipated )  - GI PPX: PPI  - DVT PPX: Lovenox 40 mg [place] daily  - Lines/Access: PIV x1.    - Foley: No    Patient was seen and discussed with Dr. Lia Foyer, who agrees with the assessment and plan.    Lucilla Edin, MD MPH  Neurology PGY-2    I have personally seen the patient with the resident physician responsible for the patient encounter. We have discussed the history, physical exam and formulated an assessment plan together.     Davey Limas L. Andreas Newport, MD  Clinical Associate Professor of Neurology  [facility]       Data Review:       Contact Information:    PCP: [facility]    Medications:  Scheduled medications:   ??? dexamethasone  4 mg Intravenous Q6H SCH   ??? enoxaparin (LOVENOX) injection  30 mg Subcutaneous Q24H   ??? levETIRAcetam  750 mg Intravenous Q12H SCH     Continuous infusions:   ??? IP okay to treat       PRN medications:     24 hour vital signs:  Temp:  [36.7 ??C (98 ??F)-36.9 ??C (98.5 ??F)] 36.9 ??C (98.5 ??F)  Heart Rate:  [41-71] 47  SpO2 Pulse:  [41-68] 50  Resp:  [11-27] 11  BP: (104-138)/(64-84) 117/68  MAP (mmHg):  [79-96] 85  SpO2:  [94 %-99 %] 99 %    Ins and Outs:  No intake/output data recorded.    Laboratory values:  All Labs Last 24hrs:   Recent Results (from the past 24 hour(s))   PT-INR    Collection Time: 01/30/22  5:54 PM   Result Value Ref Range    PT 13.6 (H) 9.8 - 12.8 sec    INR 1.19    Basic Metabolic Panel    Collection Time: 01/31/22  3:58 AM   Result Value Ref Range    Sodium 138 135 - 145 mmol/L    Potassium 5.1 (H) 3.4 - 4.8 mmol/L    Chloride 106 98 - 107 mmol/L    CO2 22.0 20.0 - 31.0 mmol/L    Anion Gap 10 5 - 14 mmol/L    BUN 10 9 - 23 mg/dL    Creatinine 1.61 (L) 0.60 - 1.10 mg/dL    BUN/Creatinine Ratio 19     eGFR CKD-EPI (2021) Male >90 >=60 mL/min/1.69m2    Glucose 97 70 - 179 mg/dL    Calcium 9.7 8.7 - 09.6 mg/dL   CBC w/ Differential    Collection Time: 01/31/22  3:58 AM   Result Value Ref Range    WBC 7.5 3.6 - 11.2 10*9/L    RBC 4.28 4.26 - 5.60 10*12/L    HGB 13.6 12.9 - 16.5 g/dL    HCT 04.5 (L) 40.9 - 48.0 %    MCV 90.1 77.6 - 95.7 fL    MCH 31.7 25.9 - 32.4 pg    MCHC 35.2 32.0 - 36.0 g/dL    RDW 81.1 91.4 - 78.2 %    MPV 7.0 6.8 - 10.7 fL    Platelet 285 150 - 450 10*9/L    Neutrophils % 76.7 %  Lymphocytes % 14.2 % Monocytes % 8.0 %    Eosinophils % 0.4 %    Basophils % 0.7 %    Absolute Neutrophils 5.8 1.8 - 7.8 10*9/L    Absolute Lymphocytes 1.1 1.1 - 3.6 10*9/L    Absolute Monocytes 0.6 0.3 - 0.8 10*9/L    Absolute Eosinophils 0.0 0.0 - 0.5 10*9/L    Absolute Basophils 0.1 0.0 - 0.1 10*9/L   ECG 12 Lead    Collection Time: 01/31/22  8:40 AM   Result Value Ref Range    EKG Systolic BP  mmHg    EKG Diastolic BP  mmHg    EKG Ventricular Rate 44 BPM    EKG Atrial Rate 44 BPM    EKG P-R Interval 146 ms    EKG QRS Duration 108 ms    EKG Q-T Interval 476 ms    EKG QTC Calculation 406 ms    EKG Calculated P Axis 76 degrees    EKG Calculated R Axis 63 degrees    EKG Calculated T Axis 65 degrees    QTC Fredericia 429 ms       Imaging:  Pertinent imaging discussed in the A/P section

## 2022-02-01 LAB — BASIC METABOLIC PANEL
ANION GAP: 4 mmol/L — ABNORMAL LOW (ref 5–14)
BLOOD UREA NITROGEN: 14 mg/dL (ref 9–23)
BUN / CREAT RATIO: 24
CALCIUM: 9.2 mg/dL (ref 8.7–10.4)
CHLORIDE: 103 mmol/L (ref 98–107)
CO2: 30 mmol/L (ref 20.0–31.0)
CREATININE: 0.59 mg/dL — ABNORMAL LOW
EGFR CKD-EPI (2021) MALE: >90 mL/min/{1.73_m2} (ref >=60)
GLUCOSE RANDOM: 108 mg/dL (ref 70–179)
POTASSIUM: 3.8 mmol/L (ref 3.4–4.8)
SODIUM: 137 mmol/L (ref 135–145)

## 2022-02-01 LAB — CBC W/ AUTO DIFF
BASOPHILS ABSOLUTE COUNT: 0 10*9/L (ref 0.0–0.1)
BASOPHILS RELATIVE PERCENT: 0.5 %
EOSINOPHILS ABSOLUTE COUNT: 0 10*9/L (ref 0.0–0.5)
EOSINOPHILS RELATIVE PERCENT: 0.1 %
HEMATOCRIT: 38.6 % — ABNORMAL LOW (ref 39.0–48.0)
HEMOGLOBIN: 13.6 g/dL (ref 12.9–16.5)
LYMPHOCYTES ABSOLUTE COUNT: 0.7 10*9/L — ABNORMAL LOW (ref 1.1–3.6)
LYMPHOCYTES RELATIVE PERCENT: 8.3 %
MEAN CORPUSCULAR HEMOGLOBIN CONC: 35.4 g/dL (ref 32.0–36.0)
MEAN CORPUSCULAR HEMOGLOBIN: 31.2 pg (ref 25.9–32.4)
MEAN CORPUSCULAR VOLUME: 88.2 fL (ref 77.6–95.7)
MEAN PLATELET VOLUME: 7.5 fL (ref 6.8–10.7)
MONOCYTES ABSOLUTE COUNT: 0.4 10*9/L (ref 0.3–0.8)
MONOCYTES RELATIVE PERCENT: 5.1 %
NEUTROPHILS ABSOLUTE COUNT: 7.2 10*9/L (ref 1.8–7.8)
NEUTROPHILS RELATIVE PERCENT: 86 %
PLATELET COUNT: 332 10*9/L (ref 150–450)
RED BLOOD CELL COUNT: 4.37 10*12/L (ref 4.26–5.60)
RED CELL DISTRIBUTION WIDTH: 13.4 % (ref 12.2–15.2)
WBC ADJUSTED: 8.3 10*9/L (ref 3.6–11.2)

## 2022-02-01 MED ORDER — DEXAMETHASONE 4 MG TABLET
ORAL_TABLET | Freq: Four times a day (QID) | ORAL | 0 refills | 7.00000 days | Status: CP
Start: 2022-02-01 — End: ?
  Filled 2022-02-01: qty 28, 7d supply, fill #0

## 2022-02-01 MED ORDER — LEVETIRACETAM 750 MG TABLET
ORAL_TABLET | Freq: Two times a day (BID) | ORAL | 2 refills | 30.00000 days | Status: CN
Start: 2022-02-01 — End: 2022-05-02
  Filled 2022-02-01: qty 60, 30d supply, fill #0

## 2022-02-01 MED ADMIN — levETIRAcetam (KEPPRA) injection 750 mg: 750 mg | INTRAVENOUS

## 2022-02-01 MED ADMIN — dexamethasone (DECADRON) 4 mg/mL injection 4 mg: 4 mg | INTRAVENOUS | @ 14:00:00 | Stop: 2022-02-01

## 2022-02-01 MED ADMIN — levETIRAcetam (KEPPRA) injection 750 mg: 750 mg | INTRAVENOUS | @ 14:00:00 | Stop: 2022-02-01

## 2022-02-01 MED ADMIN — dexamethasone (DECADRON) 4 mg/mL injection 4 mg: 4 mg | INTRAVENOUS | @ 08:00:00 | Stop: 2022-02-01

## 2022-02-01 MED ADMIN — enoxaparin (LOVENOX) syringe 30 mg: 30 mg | SUBCUTANEOUS | @ 14:00:00 | Stop: 2022-02-01

## 2022-02-01 MED ADMIN — dexAMETHasone (DECADRON) tablet 4 mg: 4 mg | ORAL | @ 19:00:00 | Stop: 2022-02-01

## 2022-02-01 MED ADMIN — dexamethasone (DECADRON) 4 mg/mL injection 4 mg: 4 mg | INTRAVENOUS
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
## 2022-02-01 NOTE — Unmapped (Signed)
A&O x 1-2, disoriented to time and situation, all extremities 4/5 strengths. Clear liquids tolerated, with minimal intake. Bradycardic the whole shift, but asymptomatic. No complaints of pain. Voiding spontaneously. Condom cath in place. Fall precautions maintained. Bed positioned low, bed wheels locked, side rails up x 4, bed alarm activated, call bell within reach. No fall or injury during the shift. Fiancee at bedside. Will monitor.  Problem: Adult Inpatient Plan of Care  Goal: Plan of Care Review  Outcome: Ongoing - Unchanged  Goal: Patient-Specific Goal (Individualized)  Outcome: Ongoing - Unchanged  Goal: Absence of Hospital-Acquired Illness or Injury  Outcome: Ongoing - Unchanged  Intervention: Identify and Manage Fall Risk  Recent Flowsheet Documentation  Taken 02/01/2022 0200 by Constance Holster, RN  Safety Interventions:  ??? aspiration precautions  ??? bed alarm  ??? fall reduction program maintained  ??? family at bedside  ??? low bed  ??? nonskid shoes/slippers when out of bed  Taken 02/01/2022 0000 by Constance Holster, RN  Safety Interventions:  ??? aspiration precautions  ??? bed alarm  ??? fall reduction program maintained  ??? family at bedside  ??? low bed  ??? nonskid shoes/slippers when out of bed  Taken 01/31/2022 2200 by Constance Holster, RN  Safety Interventions:  ??? aspiration precautions  ??? bed alarm  ??? fall reduction program maintained  ??? family at bedside  ??? low bed  ??? nonskid shoes/slippers when out of bed  Taken 01/31/2022 2000 by Constance Holster, RN  Safety Interventions:  ??? aspiration precautions  ??? bed alarm  ??? fall reduction program maintained  ??? family at bedside  ??? low bed  Intervention: Prevent and Manage VTE (Venous Thromboembolism) Risk  Recent Flowsheet Documentation  Taken 02/01/2022 0200 by Constance Holster, RN  Activity Management: activity adjusted per tolerance  Taken 02/01/2022 0000 by Constance Holster, RN  Activity Management: activity adjusted per tolerance  Taken 01/31/2022 2200 by Constance Holster, RN  Activity Management: activity adjusted per tolerance  Taken 01/31/2022 2000 by Constance Holster, RN  Activity Management: activity adjusted per tolerance  Goal: Optimal Comfort and Wellbeing  Outcome: Ongoing - Unchanged  Goal: Readiness for Transition of Care  Outcome: Ongoing - Unchanged  Goal: Rounds/Family Conference  Outcome: Ongoing - Unchanged     Problem: Fall Injury Risk  Goal: Absence of Fall and Fall-Related Injury  Outcome: Ongoing - Unchanged  Intervention: Promote Injury-Free Environment  Recent Flowsheet Documentation  Taken 02/01/2022 0200 by Constance Holster, RN  Safety Interventions:  ??? aspiration precautions  ??? bed alarm  ??? fall reduction program maintained  ??? family at bedside  ??? low bed  ??? nonskid shoes/slippers when out of bed  Taken 02/01/2022 0000 by Constance Holster, RN  Safety Interventions:  ??? aspiration precautions  ??? bed alarm  ??? fall reduction program maintained  ??? family at bedside  ??? low bed  ??? nonskid shoes/slippers when out of bed  Taken 01/31/2022 2200 by Constance Holster, RN  Safety Interventions:  ??? aspiration precautions  ??? bed alarm  ??? fall reduction program maintained  ??? family at bedside  ??? low bed  ??? nonskid shoes/slippers when out of bed  Taken 01/31/2022 2000 by Constance Holster, RN  Safety Interventions:  ??? aspiration precautions  ??? bed alarm  ??? fall reduction program maintained  ??? family at bedside  ??? low bed     Problem: Skin Injury Risk Increased  Goal: Skin Health and  Integrity  Outcome: Ongoing - Unchanged  Intervention: Optimize Skin Protection  Recent Flowsheet Documentation  Taken 02/01/2022 0200 by Constance Holster, RN  Head of Bed [facility]) Positioning: HOB at 30 degrees  Taken 02/01/2022 0000 by Constance Holster, RN  Head of Bed [facility]) Positioning: HOB at 30 degrees  Taken 01/31/2022 2200 by Constance Holster, RN  Head of Bed [facility]) Positioning: HOB at 30 degrees  Taken 01/31/2022 2000 by Constance Holster, RN  Head of Bed [facility]) Positioning: HOB at 30 degrees     Problem: Self-Care Deficit  Goal: Improved Ability to Complete Activities of Daily Living  Outcome: Ongoing - Unchanged     Problem: Impaired Wound Healing  Goal: Optimal Wound Healing  Outcome: Ongoing - Unchanged  Intervention: Promote Wound Healing  Recent Flowsheet Documentation  Taken 02/01/2022 0200 by Constance Holster, RN  Activity Management: activity adjusted per tolerance  Taken 02/01/2022 0000 by Constance Holster, RN  Activity Management: activity adjusted per tolerance  Taken 01/31/2022 2200 by Constance Holster, RN  Activity Management: activity adjusted per tolerance  Taken 01/31/2022 2000 by Constance Holster, RN  Activity Management: activity adjusted per tolerance     Problem: Seizure Disorder Comorbidity  Goal: Maintenance of Seizure Control  Outcome: Ongoing - Unchanged

## 2022-02-01 NOTE — Unmapped (Signed)
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 *  Resolved Problems:    * No resolved hospital problems. *       Follow-up Issues:   Outpatient neuro-oncology appt - can discuss need to continue steroids  Outpatient Avastin infusion on 6/19    Procedures: cvEEG    Consults:  neuro-oncology    Pertinent Test Results:     MRI Brain with and without contrast and MRA of the brain without contrast, 01/30/22:  Impression       1. Sequelae of right frontal craniotomy for right frontal tumor resection with decreased size and prominence of peripheral enhancement of residual infiltrative mass involving the bifrontal lobes.       2. Progression of nonspecific FLAIR signal abnormality throughout the bilateral frontal lobes with mildly increased regional mass effect with compression of the frontal horns of the lateral ventricles and approximately 1.3 cm of left-to-right midline shift.       3. 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     Continuous video EEG, 01/30/22:  FINAL INTERPRETATION:   Abnormal EEG due to:  - Moderate diffuse background (theta/delta) slowing  - Higher amplitude faster frequencies over the right frontocentral regions, consistent with breach rhythm      Hospital Course:   Jorge Contreras. is a 41 y.o. 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 Most recent MRI (12/30/2021) showed significant disease burden and extension into the left frontal lobe. Follows with Dr. Reather Converse, his [facility] neuro-oncologist, and currently on avastin, previously on temodar. Pt was admitted for 3 days of worsening cognition and ambulation, as well as several episodes of brief (3 to 5 seconds) shaking of both legs and right arm.  Was loaded with Keppra in the ED and started on maintenance Keppra; pt had been noncompliant with keppra 750 mg BID prior to this admission. video  EEG showed higher amplitude faster frequencies over the right frontocentral regions, consistent with breach rhythm, but no seizures or epileptiform activity. Repeat MRI brain w wo contrast 6/16 demonstrated increased vasogenic edema in bilateral frontal lobes and approximately 1.3 cm of left to right midline shift. Pt had return to baseline mental status on 6/1 after being started on decadron. Dr. Reather Converse held a GOC discussion with pt and family on 6/16 where his prognosis was discussed, with plan to continue Avastin outpatient. He was evaluated by PT/OT as 5xL but would prefer to discharge home with home health/PT/OT.    Condition at Discharge: stable    Discharge Exam:   General Appearance:Chronically ill appearing.   HEENT: Postcraniotomy scar.Sclera anicteric without injection. Oropharyngeal membranes are moist with no erythema or exudate.  Lungs: Normal work of breathing.    Heart: Bradycardic.  Abdomen: Nondistended.  Extremities: No clubbing, cyanosis, or edema.     Neurological Examination:      Mental Status: Arouses to verbal stimulus. Able to answer simple questions with delay. Oriented to self and place, not time or situation. Follows simple commands.     Cranial Nerves:   Pupils are equal and reactive.  EOMI.  Face is symmetric at rest and with activation.     Motor Exam:   Globally decreased bulk.  Normal tone.  Spontaneous and at least 3/5 throughout.     Reflexes: Deferred, previously: DTRs R/L: biceps 2+/2+, brachioradialis 2+/2+, patella 2+/2+, and ankle jerk 2+/2+. Slight fanning upwards of toes bilaterally.  5-6 beats of clonus in the left ankle.  2-3 beats of clonus in the right ankle.     Sensory: Intact to LT in bilateral upper and lower extremities.      Cerebellar/Coordination/Gait: Did not assess.    Discharge Medications:     Your Medication List        STOP taking these medications      famotidine 20 MG tablet  Commonly known as: PEPCID     mometasone 0.1 % cream  Commonly known as: ELOCON     naproxen sodium 220 MG tablet  Commonly known as: ALEVE     temozolomide 20 mg capsule  Commonly known as: TEMODAR     temozolomide 250 mg capsule  Commonly known as: TEMODAR            CHANGE how you take these medications      dexAMETHasone 4 MG tablet  Commonly known as: DECADRON  Take 1 tablet (4 mg total) by mouth every six (6) hours.  What changed:   medication strength  how much to take  how to take this  when to take this  additional instructions            CONTINUE taking these medications      levETIRAcetam 750 MG tablet  Commonly known as: KEPPRA  Take 1 tablet (750 mg total) by mouth Two (2) times a day.     ondansetron 8 MG tablet  Commonly known as: ZOFRAN  Take one hour before chemotherapy and every 8 hours as needed for nausea     prochlorperazine 10 MG tablet  Commonly known as: COMPAZINE  Take 1 tablet (10 mg total) by mouth every six (6) hours as needed (Nausea/Vomiting).     sulfamethoxazole-trimethoprim 800-160 mg per tablet  Commonly known as: BACTRIM DS  Take 1 tablet (160 mg of trimethoprim total) by mouth Every Monday, Wednesday, and Friday.              Discharge Instructions:   Activity Instructions       Activity as tolerated            Other Instructions       Discharge instructions      You were admitted to [facility] for confusion and concern for seizure activity. You were restarted on decadron (a steroid to help with swelling in the brain) and keppra (anti-seizure medication). Continue to take these medications after discharge. We have also referred you for home health with PT/OT, and equipment (wheelchair, shower bench, and commode) will be delivered to your home. You have an Avastin infusion appointment and a clinic appointment with Dr. Reather Converse tomorrow (June 19).    Seizure precautions:  Seizures may happen at any time. It is  important to take certain precautions to maintain your safety. When possible, take showers instead of baths, as it is possible to drown in even shallow water during a seizure. Do not swim unsupervised or in open water where rescue could be difficult. Do not climb to heights and do not operate heavy machinery. When cooking, use the back burners of the stove and avoid open flames or hot stove tops. Avoid any activities which could be dangerous in the event of a loss of consciousness.  You should not drive. You must report yourself to the [facility] manager Program by calling [phone]. You will be provided with a packet of paperwork regarding your medical and seizure history. Please fill out all personal information sections prior to giving this packet to your neurologist or primary care physician. An independent physician with the Department of Motor Vehicles will review your case and determine whether or not you will be allowed to drive. You should not drive until you receive a formal decision from the [facility].          Appointments which have been scheduled for you      Feb 02, 2022  9:30 AM  (Arrive by 9:00 AM)  LAB ONLY [place] with ADULT ONC LAB  [facility] ADULT ONCOLOGY LAB DRAW STATION [place] [facility] REGION) [address]  [place] [address]  [phone]        Feb 02, 2022 10:30 AM  (Arrive by 10:00 AM)  RETURN ACTIVE [place] with Janus Molder, MD  [facility] SURGERY ONCOLOGY [facility] REGION) [address]  [place] [address]  [phone]        Feb 02, 2022 11:30 AM  (Arrive by 11:00 AM)  LEVEL 150 with [employer] CHAIR 05  [place] ONCOLOGY INFUSION [facility] REGION) [address]  [place] [address]  [phone]        Feb 16, 2022  3:00 PM  (Arrive by 2:30 PM)  LEVEL 180 with ONCINF CHAIR 28  [place] ONCOLOGY INFUSION [place] [facility] REGION) [address]  [place] [address]  [phone]        Mar 02, 2022  2:00 PM  (Arrive by 1:30 PM)  LEVEL 180 with [employer] CHAIR 31  [place] ONCOLOGY INFUSION [place] [facility] REGION) [address]  [place] [address]  [phone]        Mar 16, 2022  2:00 PM  (Arrive by 1:30 PM)  LEVEL 180 with [employer] CHAIR 21  [place] ONCOLOGY INFUSION [place] [facility] REGION) [address]  [place] [address]  [phone]        Mar 30, 2022  2:30 PM  (Arrive by 2:00 PM)  LEVEL 180 with [employer] CHAIR 35  [place] ONCOLOGY INFUSION [facility] REGION) [address]  [place] [address]  [phone]        Apr 13, 2022  2:30 PM  (Arrive by 2:00 PM)  LEVEL 180 with [employer] CHAIR 47  [place] ONCOLOGY INFUSION [place] [facility] REGION) [address]  [place] [address]  [phone]        Apr 27, 2022  2:30 PM  (Arrive by 2:00 PM)  LEVEL 180 with ONCINF CHAIR 13   ONCOLOGY INFUSION [place] [facility] REGION) [address]  [place] [address]  [phone]        May 11, 2022  2:30 PM  (Arrive by 2:00 PM)  LEVEL 180 with [employer] CHAIR 35  [place] ONCOLOGY INFUSION [place] [facility] REGION) [address]  [place] [address]  [phone]        May 25, 2022  2:30 PM  (Arrive by 2:00 PM)  LEVEL 180 with [employer] CHAIR 32  [place] ONCOLOGY INFUSION [place] [facility] REGION) [address]  [place] [address]  [phone]        Jun 08, 2022  2:30 PM  (Arrive by 2:00 PM)  LEVEL 180 with ONCINF CHAIR 35  [place] ONCOLOGY INFUSION [place] [facility] REGION) [address]  [place] [address]  [phone]              Resources and Referrals       Commode      Length of Need: 99 months    Type of commode: Chair    3 in 1 commode        3 in 1 commode    Transfer Bench (DME)      Type: Standard    Length of Need: 99 months    Wheelchair      Type: Standard    Seat: 18x16    Cushion: Foam    Footrests: Elevating    Options:  Removable Armrest  Anti Tips  Reclining Back       Length of Need: 99 months              I spent greater than 30 minutes in the discharge of this patient.    Sung Amabile, MD  PGY-3, [facility] Neurology        Patient's mental status back to baseline, with no new focal findings. To be discharged today, he has infusion therapy and neuro-oncology outpatient visits tomorrow as previously scheduled.     I saw and evaluated the patient, participating in the key portions of the service on the day of discharge.  I reviewed the resident???s note and agree with the discharge plans and disposition. I personally spent 20 minutes in discharge planning services.     Lysbeth Galas, MD

## 2022-02-02 ENCOUNTER — Other Ambulatory Visit: Admit: 2022-02-02 | Discharge: 2022-02-02

## 2022-02-02 ENCOUNTER — Ambulatory Visit: Admit: 2022-02-02 | Discharge: 2022-02-02

## 2022-02-02 DIAGNOSIS — D496 Neoplasm of unspecified behavior of brain: Principal | ICD-10-CM

## 2022-02-02 DIAGNOSIS — C719 Malignant neoplasm of brain, unspecified: Principal | ICD-10-CM

## 2022-02-02 LAB — CBC W/ AUTO DIFF
BASOPHILS ABSOLUTE COUNT: 0 10*9/L (ref 0.0–0.1)
BASOPHILS RELATIVE PERCENT: 0.3 %
EOSINOPHILS ABSOLUTE COUNT: 0 10*9/L (ref 0.0–0.5)
EOSINOPHILS RELATIVE PERCENT: 0 %
HEMATOCRIT: 42.2 % (ref 39.0–48.0)
HEMOGLOBIN: 15 g/dL (ref 12.9–16.5)
LYMPHOCYTES ABSOLUTE COUNT: 0.6 10*9/L — ABNORMAL LOW (ref 1.1–3.6)
LYMPHOCYTES RELATIVE PERCENT: 7.2 %
MEAN CORPUSCULAR HEMOGLOBIN CONC: 35.4 g/dL (ref 32.0–36.0)
MEAN CORPUSCULAR HEMOGLOBIN: 31.2 pg (ref 25.9–32.4)
MEAN CORPUSCULAR VOLUME: 88.1 fL (ref 77.6–95.7)
MEAN PLATELET VOLUME: 7.5 fL (ref 6.8–10.7)
MONOCYTES ABSOLUTE COUNT: 0.4 10*9/L (ref 0.3–0.8)
MONOCYTES RELATIVE PERCENT: 4.8 %
NEUTROPHILS ABSOLUTE COUNT: 7.7 10*9/L (ref 1.8–7.8)
NEUTROPHILS RELATIVE PERCENT: 87.7 %
PLATELET COUNT: 385 10*9/L (ref 150–450)
RED BLOOD CELL COUNT: 4.79 10*12/L (ref 4.26–5.60)
RED CELL DISTRIBUTION WIDTH: 13.7 % (ref 12.2–15.2)
WBC ADJUSTED: 8.8 10*9/L (ref 3.6–11.2)

## 2022-02-02 LAB — COMPREHENSIVE METABOLIC PANEL
ALBUMIN: 4.1 g/dL (ref 3.4–5.0)
ALKALINE PHOSPHATASE: 75 U/L (ref 46–116)
ALT (SGPT): 31 U/L (ref 10–49)
ANION GAP: 8 mmol/L (ref 5–14)
AST (SGOT): 12 U/L (ref <=34)
BILIRUBIN TOTAL: 0.3 mg/dL (ref 0.3–1.2)
BLOOD UREA NITROGEN: 11 mg/dL (ref 9–23)
BUN / CREAT RATIO: 19
CALCIUM: 9.6 mg/dL (ref 8.7–10.4)
CHLORIDE: 100 mmol/L (ref 98–107)
CO2: 29 mmol/L (ref 20.0–31.0)
CREATININE: 0.58 mg/dL — ABNORMAL LOW
EGFR CKD-EPI (2021) MALE: >90 mL/min/{1.73_m2} (ref >=60)
GLUCOSE RANDOM: 97 mg/dL (ref 70–179)
POTASSIUM: 3.8 mmol/L (ref 3.4–4.8)
PROTEIN TOTAL: 7.7 g/dL (ref 5.7–8.2)
SODIUM: 137 mmol/L (ref 135–145)

## 2022-02-02 MED ADMIN — sodium chloride (NS) 0.9 % infusion: 100 mL/h | INTRAVENOUS | @ 18:00:00 | Stop: 2022-02-02

## 2022-02-02 MED ADMIN — bevacizumab (AVASTIN) 349.5 mg in sodium chloride (NS) 0.9 % 100 mL IVPB: 5 mg/kg | INTRAVENOUS | @ 18:00:00 | Stop: 2022-02-02

## 2022-02-02 NOTE — Unmapped (Signed)
Lab on 02/02/2022   Component Date Value Ref Range Status    Sodium 02/02/2022 137  135 - 145 mmol/L Final    Potassium 02/02/2022 3.8  3.4 - 4.8 mmol/L Final    Chloride 02/02/2022 100  98 - 107 mmol/L Final    CO2 02/02/2022 29.0  20.0 - 31.0 mmol/L Final    Anion Gap 02/02/2022 8  5 - 14 mmol/L Final    BUN 02/02/2022 11  9 - 23 mg/dL Final    Creatinine 09/81/1914 0.58 (L)  0.60 - 1.10 mg/dL Final    BUN/Creatinine Ratio 02/02/2022 19   Final    eGFR CKD-EPI (2021) Male 02/02/2022 >90  >=60 mL/min/1.55m2 Final    eGFR calculated with CKD-EPI 2021 equation in accordance with [employer] and [employer] of Nephrology Task Force recommendations.    Glucose 02/02/2022 97  70 - 179 mg/dL Final    Calcium 78/29/5621 9.6  8.7 - 10.4 mg/dL Final    Albumin 30/86/5784 4.1  3.4 - 5.0 g/dL Final    Total Protein 02/02/2022 7.7  5.7 - 8.2 g/dL Final    Total Bilirubin 02/02/2022 0.3  0.3 - 1.2 mg/dL Final    AST 69/62/9528 12  <=34 U/L Final    ALT 02/02/2022 31  10 - 49 U/L Final    Alkaline Phosphatase 02/02/2022 75  46 - 116 U/L Final    WBC 02/02/2022 8.8  3.6 - 11.2 10*9/L Final    RBC 02/02/2022 4.79  4.26 - 5.60 10*12/L Final    HGB 02/02/2022 15.0  12.9 - 16.5 g/dL Final    HCT 41/32/4401 42.2  39.0 - 48.0 % Final    MCV 02/02/2022 88.1  77.6 - 95.7 fL Final    MCH 02/02/2022 31.2  25.9 - 32.4 pg Final    MCHC 02/02/2022 35.4  32.0 - 36.0 g/dL Final    RDW 02/72/5366 13.7  12.2 - 15.2 % Final    MPV 02/02/2022 7.5  6.8 - 10.7 fL Final    Platelet 02/02/2022 385  150 - 450 10*9/L Final    Neutrophils % 02/02/2022 87.7  % Final    Lymphocytes % 02/02/2022 7.2  % Final    Monocytes % 02/02/2022 4.8  % Final    Eosinophils % 02/02/2022 0.0  % Final    Basophils % 02/02/2022 0.3  % Final    Absolute Neutrophils 02/02/2022 7.7  1.8 - 7.8 10*9/L Final    Absolute Lymphocytes 02/02/2022 0.6 (L)  1.1 - 3.6 10*9/L Final    Absolute Monocytes 02/02/2022 0.4  0.3 - 0.8 10*9/L Final    Absolute Eosinophils 02/02/2022 0.0  0.0 - 0.5 10*9/L Final    Absolute Basophils 02/02/2022 0.0  0.0 - 0.1 10*9/L Final    Spec Gravity/POC 02/02/2022 1.020  1.003 - 1.030 Final    PH/POC 02/02/2022 6.0  5.0 - 9.0 Final    Leuk Esterase/POC 02/02/2022 1+ (A)  Negative Final    Nitrite/POC 02/02/2022 Negative  Negative Final    Protein/POC 02/02/2022 Negative  Negative Final    UA Glucose/POC 02/02/2022 Negative  Negative Final    Ketones, POC 02/02/2022 Negative  Negative Final    Bilirubin/POC 02/02/2022 Negative  Negative Final    Blood/POC 02/02/2022 Negative  Negative Final    Urobilinogen/POC 02/02/2022 1.0  0.2 - 1.0 mg/dL Final

## 2022-02-02 NOTE — Unmapped (Signed)
PIV placed.  Labs drawn & sent for analysis. To next appt.  Care provided by Shelly Wood RN.
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
## 2022-02-02 NOTE — Unmapped (Signed)
Pharmacy: First Cycle Chemotherapy Patient Education    Chemotherapy regimen/agents: Bevacizumab  Venous Access: Peripheral Line  Caregivers present for education: Wife    Jorge Contreras is a 41 y.o. with astrocytoma, WHO grade 4, IDH mutated. Chemotherapy education was provided to the patient by the oncology pharmacy team.    Side effects discussed included but were not limited to:    Hypertension, risk for bleeding, risk for clotting including risk for VTE, myocardial infarction, and stroke, delayed wound healing, complications associated with myelosuppresion (such as infection/fever, fatigue, and bleeding), nausea/vomiting, cardiac toxicity, renal toxicity, or fatigue.    The patient handout from [facility] or the Hematology/Oncology Fellow on-call phone number for concerns after hours were given.The patient verbalized understanding of this information.  Medication reconciliation was completed and the medication list was updated in EPIC.      Approximate time spent with patient: 15  Minutes.    Elmer Ramp, Ilda Basset D  PGY-2 Oncology Resident    Kennon Holter, PharmD, CPP

## 2022-02-02 NOTE — Unmapped (Signed)
Spoke with [place].    Labs were delayed because [facility] could not give a urine sample.  They are on the way now.

## 2022-02-02 NOTE — Unmapped (Unsigned)
Patient arrived to chair 25.  No complaints noted.  Access of PIV intact with blood return.  Pt tolerated infusion without difficulty. PIV checked for blood return, flushed, and removed. AVS printed. Patient left infusion center, NAD.

## 2022-02-05 DIAGNOSIS — C719 Malignant neoplasm of brain, unspecified: Principal | ICD-10-CM

## 2022-02-05 LAB — HEPATITIS B SURFACE ANTIBODY
HEPATITIS B SURFACE ANTIBODY QUANT: 49.64 m[IU]/mL — ABNORMAL HIGH (ref <8.00)
HEPATITIS B SURFACE ANTIBODY: REACTIVE — AB

## 2022-02-05 LAB — HEPATITIS B CORE ANTIBODY, TOTAL: HEPATITIS B CORE TOTAL ANTIBODY: NONREACTIVE

## 2022-02-05 LAB — HEPATITIS B SURFACE ANTIGEN: HEPATITIS B SURFACE ANTIGEN: NONREACTIVE

## 2022-02-06 MED ORDER — DEXAMETHASONE 4 MG TABLET
ORAL_TABLET | 0 refills | 0 days | Status: CP
Start: 2022-02-06 — End: ?

## 2022-02-16 ENCOUNTER — Other Ambulatory Visit: Admit: 2022-02-16 | Discharge: 2022-02-17

## 2022-02-16 ENCOUNTER — Ambulatory Visit: Admit: 2022-02-16 | Discharge: 2022-02-17

## 2022-02-16 DIAGNOSIS — C719 Malignant neoplasm of brain, unspecified: Principal | ICD-10-CM

## 2022-02-16 LAB — CBC W/ AUTO DIFF
BASOPHILS ABSOLUTE COUNT: 0.1 10*9/L (ref 0.0–0.1)
BASOPHILS RELATIVE PERCENT: 0.8 %
EOSINOPHILS ABSOLUTE COUNT: 0.1 10*9/L (ref 0.0–0.5)
EOSINOPHILS RELATIVE PERCENT: 0.7 %
HEMATOCRIT: 35.3 % — ABNORMAL LOW (ref 39.0–48.0)
HEMOGLOBIN: 12.3 g/dL — ABNORMAL LOW (ref 12.9–16.5)
LYMPHOCYTES ABSOLUTE COUNT: 0.9 10*9/L — ABNORMAL LOW (ref 1.1–3.6)
LYMPHOCYTES RELATIVE PERCENT: 12 %
MEAN CORPUSCULAR HEMOGLOBIN CONC: 34.8 g/dL (ref 32.0–36.0)
MEAN CORPUSCULAR HEMOGLOBIN: 31.6 pg (ref 25.9–32.4)
MEAN CORPUSCULAR VOLUME: 90.7 fL (ref 77.6–95.7)
MEAN PLATELET VOLUME: 7.1 fL (ref 6.8–10.7)
MONOCYTES ABSOLUTE COUNT: 0.6 10*9/L (ref 0.3–0.8)
MONOCYTES RELATIVE PERCENT: 7.8 %
NEUTROPHILS ABSOLUTE COUNT: 6 10*9/L (ref 1.8–7.8)
NEUTROPHILS RELATIVE PERCENT: 78.7 %
PLATELET COUNT: 267 10*9/L (ref 150–450)
RED BLOOD CELL COUNT: 3.89 10*12/L — ABNORMAL LOW (ref 4.26–5.60)
RED CELL DISTRIBUTION WIDTH: 13.8 % (ref 12.2–15.2)
WBC ADJUSTED: 7.7 10*9/L (ref 3.6–11.2)

## 2022-02-16 LAB — COMPREHENSIVE METABOLIC PANEL
ALBUMIN: 3.3 g/dL — ABNORMAL LOW (ref 3.4–5.0)
ALKALINE PHOSPHATASE: 59 U/L (ref 46–116)
ALT (SGPT): 16 U/L (ref 10–49)
ANION GAP: 5 mmol/L (ref 5–14)
AST (SGOT): 12 U/L (ref <=34)
BILIRUBIN TOTAL: 0.3 mg/dL (ref 0.3–1.2)
BLOOD UREA NITROGEN: 11 mg/dL (ref 9–23)
BUN / CREAT RATIO: 17
CALCIUM: 8.7 mg/dL (ref 8.7–10.4)
CHLORIDE: 107 mmol/L (ref 98–107)
CO2: 26 mmol/L (ref 20.0–31.0)
CREATININE: 0.66 mg/dL
EGFR CKD-EPI (2021) MALE: >90 mL/min/{1.73_m2} (ref >=60)
GLUCOSE RANDOM: 82 mg/dL (ref 70–179)
POTASSIUM: 4 mmol/L (ref 3.4–4.8)
PROTEIN TOTAL: 6.3 g/dL (ref 5.7–8.2)
SODIUM: 138 mmol/L (ref 135–145)

## 2022-02-16 MED ADMIN — sodium chloride (NS) 0.9 % infusion: 100 mL/h | INTRAVENOUS | @ 17:00:00

## 2022-02-16 MED ADMIN — bevacizumab (AVASTIN) 349.5 mg in sodium chloride (NS) 0.9 % 100 mL IVPB: 5 mg/kg | INTRAVENOUS | @ 18:00:00 | Stop: 2022-02-16

## 2022-02-16 NOTE — Unmapped (Signed)
Monday - Friday from 8:00 am - 5:00 pm  Call [phone]  Or toll free [phone]  Ask to speak to the Triage Nurse   On Nights, Weekends, and Holidays  Call [phone]  Ask the operator to page the  Oncology Fellow on Call    RED ZONE Means: RED ZONE: Take action now!      You need to be seen right away  Symptoms are at a severe level of discomfort    Call 911 or go to your nearest  Hospital for help   - Bleeding that will not stop  - Hard to breathe  - New seizure - Chest pain  - Fall or passing out  -Thoughts of hurting   Yourself or others      Call 911 if you are going into the RED ZONE                  YELLOW ZONE Means:     This is not an all-inclusive list. Please call with any new or worsening symptom.  Call [phone] [After hours, weekends, and holidays you will reach a Nurse Triage service to best assist you.]  You can be seen by a provider the same day through our Same Day Acute Care for Patients with Cancer program.      YELLOW ZONE: Take action today     Symptoms are new or worsening  You are not within your goal range for:  - Pain  - Shortness of breath  - Bleeding (nose, urine, stool, wound)  - Feeling sick to your stomach and throwing up  - Mouth sores/pain in your mouth or throat  - Hard stool or very loose stools (increase in ostomy output)  - No urine for 12 hours  - Feeding tube or other catheter/tube issue  - Redness or pain at previous IV or port/catheter site  - Depressed or anxiety       - Swelling (leg, arm, abdomen, face, neck)  - Skin rash or skin changes  - Wound issues (redness, drainage, re-opened)  - Confusion  - Vision changes  - Fever >100.4 F, chills  - Worsening cough with mucus that is green, yellow, or bloody  - Pain or burning when going to the bathroom  - Home Infusion Pump Issue- call [phone]    Call your healthcare provider if you are going into the YELLOW ZONE      GREEN ZONE Means:  Your symptoms are under controls  Continue to take your medicine as ordered  Keep all visits to the provider GREEN ZONE: You are in control  No increase or worsening symptoms  Able to take your medicine  Able to drink and eat    For your best care and safety, please DO NOT use MyChart messages to report red or yellow symptoms. MyChart messages are only checked during weekday normal business hours. Please allow up to 3 business days for a reply.  Please use MyChart only for the following:  -Non-urgent medication refills, scheduling requests, or general questions.         [id number] Rev. 05/13/2021  Approved by Oncology Patient Education Committee                 Lab on 02/16/2022   Component Date Value Ref Range Status    Sodium 02/16/2022 138  135 - 145 mmol/L Final    Potassium 02/16/2022 4.0  3.4 - 4.8 mmol/L Final    Chloride 02/16/2022 107  98 -  107 mmol/L Final    CO2 02/16/2022 26.0  20.0 - 31.0 mmol/L Final    Anion Gap 02/16/2022 5  5 - 14 mmol/L Final    BUN 02/16/2022 11  9 - 23 mg/dL Final    Creatinine 09/81/1914 0.66  0.60 - 1.10 mg/dL Final    BUN/Creatinine Ratio 02/16/2022 17   Final    eGFR CKD-EPI (2021) Male 02/16/2022 >90  >=60 mL/min/1.78m2 Final    eGFR calculated with CKD-EPI 2021 equation in accordance with [employer] and [employer] of Nephrology Task Force recommendations.    Glucose 02/16/2022 82  70 - 179 mg/dL Final    Calcium 78/29/5621 8.7  8.7 - 10.4 mg/dL Final    Albumin 30/86/5784 3.3 (L)  3.4 - 5.0 g/dL Final    Total Protein 02/16/2022 6.3  5.7 - 8.2 g/dL Final    Total Bilirubin 02/16/2022 0.3  0.3 - 1.2 mg/dL Final    AST 69/62/9528 12  <=34 U/L Final    ALT 02/16/2022 16  10 - 49 U/L Final    Alkaline Phosphatase 02/16/2022 59  46 - 116 U/L Final    WBC 02/16/2022 7.7  3.6 - 11.2 10*9/L Final    RBC 02/16/2022 3.89 (L)  4.26 - 5.60 10*12/L Final    HGB 02/16/2022 12.3 (L)  12.9 - 16.5 g/dL Final    HCT 41/32/4401 35.3 (L)  39.0 - 48.0 % Final    MCV 02/16/2022 90.7  77.6 - 95.7 fL Final    MCH 02/16/2022 31.6 25.9 - 32.4 pg Final    MCHC 02/16/2022 34.8  32.0 - 36.0 g/dL Final    RDW 02/72/5366 13.8  12.2 - 15.2 % Final    MPV 02/16/2022 7.1  6.8 - 10.7 fL Final    Platelet 02/16/2022 267  150 - 450 10*9/L Final    Neutrophils % 02/16/2022 78.7  % Final    Lymphocytes % 02/16/2022 12.0  % Final    Monocytes % 02/16/2022 7.8  % Final    Eosinophils % 02/16/2022 0.7  % Final    Basophils % 02/16/2022 0.8  % Final    Absolute Neutrophils 02/16/2022 6.0  1.8 - 7.8 10*9/L Final    Absolute Lymphocytes 02/16/2022 0.9 (L)  1.1 - 3.6 10*9/L Final    Absolute Monocytes 02/16/2022 0.6  0.3 - 0.8 10*9/L Final    Absolute Eosinophils 02/16/2022 0.1  0.0 - 0.5 10*9/L Final    Absolute Basophils 02/16/2022 0.1  0.0 - 0.1 10*9/L Final    Spec Gravity/POC 02/16/2022 1.025  1.003 - 1.030 Final    PH/POC 02/16/2022 6.5  5.0 - 9.0 Final    Leuk Esterase/POC 02/16/2022 1+ (A)  Negative Final    Nitrite/POC 02/16/2022 Negative  Negative Final    Protein/POC 02/16/2022 Negative  Negative Final    UA Glucose/POC 02/16/2022 Negative  Negative Final    Ketones, POC 02/16/2022 Negative  Negative Final    Bilirubin/POC 02/16/2022 Negative  Negative Final    Blood/POC 02/16/2022 Negative  Negative Final    Urobilinogen/POC 02/16/2022 1.0  0.2 - 1.0 mg/dL Final

## 2022-02-16 NOTE — Unmapped (Unsigned)
PIV placed.  Labs drawn & sent for analysis. To next appt.  Care provided by Daniel Maxwell RN.

## 2022-02-16 NOTE — Unmapped (Signed)
Spoke with patient's partner.    Jorge Contreras is upset the appointment got moved.  I apologized for the change.  She is will call his sister for transportation and try their best to be there on time.

## 2022-02-16 NOTE — Unmapped (Signed)
Spoke with [place].    Checking in on [place] since the infusion appointment today was canceled.  She was not aware of the cancellation.  They do have a ride today and ready to take him.    I have reached out to infusion for potential add on today.  I will call [name] once I hear back.

## 2022-02-16 NOTE — Unmapped (Signed)
Patient came in today for Bevacizumab, patient tolerated infusion well with no adverse reactions noted. Patient was discharged in stable condition by ambulation with family member present.

## 2022-03-02 ENCOUNTER — Ambulatory Visit: Admit: 2022-03-02 | Discharge: 2022-03-02

## 2022-03-02 ENCOUNTER — Other Ambulatory Visit: Admit: 2022-03-02 | Discharge: 2022-03-02

## 2022-03-02 DIAGNOSIS — C719 Malignant neoplasm of brain, unspecified: Principal | ICD-10-CM

## 2022-03-02 LAB — CBC W/ AUTO DIFF
BASOPHILS ABSOLUTE COUNT: 0.1 10*9/L (ref 0.0–0.1)
BASOPHILS RELATIVE PERCENT: 1.4 %
EOSINOPHILS ABSOLUTE COUNT: 0.1 10*9/L (ref 0.0–0.5)
EOSINOPHILS RELATIVE PERCENT: 1.9 %
HEMATOCRIT: 36.1 % — ABNORMAL LOW (ref 39.0–48.0)
HEMOGLOBIN: 12.6 g/dL — ABNORMAL LOW (ref 12.9–16.5)
LYMPHOCYTES ABSOLUTE COUNT: 1.2 10*9/L (ref 1.1–3.6)
LYMPHOCYTES RELATIVE PERCENT: 19.9 %
MEAN CORPUSCULAR HEMOGLOBIN CONC: 35.1 g/dL (ref 32.0–36.0)
MEAN CORPUSCULAR HEMOGLOBIN: 31.5 pg (ref 25.9–32.4)
MEAN CORPUSCULAR VOLUME: 89.8 fL (ref 77.6–95.7)
MEAN PLATELET VOLUME: 6.9 fL (ref 6.8–10.7)
MONOCYTES ABSOLUTE COUNT: 0.6 10*9/L (ref 0.3–0.8)
MONOCYTES RELATIVE PERCENT: 10 %
NEUTROPHILS ABSOLUTE COUNT: 4 10*9/L (ref 1.8–7.8)
NEUTROPHILS RELATIVE PERCENT: 66.8 %
PLATELET COUNT: 515 10*9/L — ABNORMAL HIGH (ref 150–450)
RED BLOOD CELL COUNT: 4.02 10*12/L — ABNORMAL LOW (ref 4.26–5.60)
RED CELL DISTRIBUTION WIDTH: 13.7 % (ref 12.2–15.2)
WBC ADJUSTED: 6 10*9/L (ref 3.6–11.2)

## 2022-03-02 LAB — BASIC METABOLIC PANEL
ANION GAP: 5 mmol/L (ref 5–14)
BLOOD UREA NITROGEN: 7 mg/dL — ABNORMAL LOW (ref 9–23)
BUN / CREAT RATIO: 12
CALCIUM: 9.1 mg/dL (ref 8.7–10.4)
CHLORIDE: 106 mmol/L (ref 98–107)
CO2: 28 mmol/L (ref 20.0–31.0)
CREATININE: 0.57 mg/dL — ABNORMAL LOW
EGFR CKD-EPI (2021) MALE: >90 mL/min/{1.73_m2} (ref >=60)
GLUCOSE RANDOM: 88 mg/dL (ref 70–179)
POTASSIUM: 4.1 mmol/L (ref 3.4–4.8)
SODIUM: 139 mmol/L (ref 135–145)

## 2022-03-02 MED ORDER — LEVETIRACETAM 750 MG TABLET
ORAL_TABLET | Freq: Two times a day (BID) | ORAL | 6 refills | 30 days | Status: CP
Start: 2022-03-02 — End: 2022-09-28

## 2022-03-02 MED ADMIN — sodium chloride (NS) 0.9 % infusion: 100 mL/h | INTRAVENOUS | @ 20:00:00

## 2022-03-02 MED ADMIN — bevacizumab (AVASTIN) 349.5 mg in sodium chloride (NS) 0.9 % 100 mL IVPB: 5 mg/kg | INTRAVENOUS | @ 20:00:00 | Stop: 2022-03-02

## 2022-03-02 NOTE — Unmapped (Signed)
Patient arrived to chair 55.  No complaints noted.  Access of PIV intact with positive blood return. Patient meets all therapy plan parameters. Patient completed and tolerated treatment.  AVS refused and patient discharged to home.

## 2022-03-02 NOTE — Unmapped (Signed)
Lab on 03/02/2022   Component Date Value Ref Range Status    Sodium 03/02/2022 139  135 - 145 mmol/L Final    Potassium 03/02/2022 4.1  3.4 - 4.8 mmol/L Final    Chloride 03/02/2022 106  98 - 107 mmol/L Final    CO2 03/02/2022 28.0  20.0 - 31.0 mmol/L Final    Anion Gap 03/02/2022 5  5 - 14 mmol/L Final    BUN 03/02/2022 7 (L)  9 - 23 mg/dL Final    Creatinine 16/05/9603 0.57 (L)  0.60 - 1.10 mg/dL Final    BUN/Creatinine Ratio 03/02/2022 12   Final    eGFR CKD-EPI (2021) Male 03/02/2022 >90  >=60 mL/min/1.40m2 Final    eGFR calculated with CKD-EPI 2021 equation in accordance with [employer] and [employer] of Nephrology Task Force recommendations.    Glucose 03/02/2022 88  70 - 179 mg/dL Final    Calcium 54/04/8118 9.1  8.7 - 10.4 mg/dL Final    WBC 14/78/2956 6.0  3.6 - 11.2 10*9/L Final    RBC 03/02/2022 4.02 (L)  4.26 - 5.60 10*12/L Final    HGB 03/02/2022 12.6 (L)  12.9 - 16.5 g/dL Final    HCT 21/30/8657 36.1 (L)  39.0 - 48.0 % Final    MCV 03/02/2022 89.8  77.6 - 95.7 fL Final    MCH 03/02/2022 31.5  25.9 - 32.4 pg Final    MCHC 03/02/2022 35.1  32.0 - 36.0 g/dL Final    RDW 84/69/6295 13.7  12.2 - 15.2 % Final    MPV 03/02/2022 6.9  6.8 - 10.7 fL Final    Platelet 03/02/2022 515 (H)  150 - 450 10*9/L Final    Neutrophils % 03/02/2022 66.8  % Final    Lymphocytes % 03/02/2022 19.9  % Final    Monocytes % 03/02/2022 10.0  % Final    Eosinophils % 03/02/2022 1.9  % Final    Basophils % 03/02/2022 1.4  % Final    Absolute Neutrophils 03/02/2022 4.0  1.8 - 7.8 10*9/L Final    Absolute Lymphocytes 03/02/2022 1.2  1.1 - 3.6 10*9/L Final    Absolute Monocytes 03/02/2022 0.6  0.3 - 0.8 10*9/L Final    Absolute Eosinophils 03/02/2022 0.1  0.0 - 0.5 10*9/L Final    Absolute Basophils 03/02/2022 0.1  0.0 - 0.1 10*9/L Final    Spec Gravity/POC 03/02/2022 1.015  1.003 - 1.030 Final    PH/POC 03/02/2022 7.0  5.0 - 9.0 Final    Leuk Esterase/POC 03/02/2022 1+ (A)  Negative Final    Nitrite/POC 03/02/2022 Negative  Negative Final    Protein/POC 03/02/2022 Negative  Negative Final    UA Glucose/POC 03/02/2022 Negative  Negative Final    Ketones, POC 03/02/2022 Negative  Negative Final    Bilirubin/POC 03/02/2022 Negative  Negative Final    Blood/POC 03/02/2022 Negative  Negative Final    Urobilinogen/POC 03/02/2022 0.2  0.2 - 1.0 mg/dL Final
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
## 2022-03-04 NOTE — Unmapped (Signed)
Nurse navigator reached our regarding patient's infusion appointments being moved to [facility] due to patient having transportation issues getting to [facility] infusion. Was able to accommodate patients appointments being moved to [facility]. Notified [name], emergency contact of appointment times and how to get to [facility] location and check in process.

## 2022-03-16 ENCOUNTER — Institutional Professional Consult (permissible substitution): Admit: 2022-03-16 | Discharge: 2022-03-17 | Payer: MEDICAID

## 2022-03-16 ENCOUNTER — Ambulatory Visit: Admit: 2022-03-16 | Discharge: 2022-03-17 | Payer: MEDICAID

## 2022-03-16 DIAGNOSIS — D496 Neoplasm of unspecified behavior of brain: Principal | ICD-10-CM

## 2022-03-16 DIAGNOSIS — C719 Malignant neoplasm of brain, unspecified: Principal | ICD-10-CM

## 2022-03-16 LAB — COMPREHENSIVE METABOLIC PANEL
ALBUMIN: 3.5 g/dL (ref 3.4–5.0)
ALKALINE PHOSPHATASE: 61 U/L (ref 46–116)
ALT (SGPT): 7 U/L — ABNORMAL LOW (ref 10–49)
ANION GAP: 5 mmol/L (ref 5–14)
AST (SGOT): 14 U/L (ref <=34)
BILIRUBIN TOTAL: 0.4 mg/dL (ref 0.3–1.2)
BLOOD UREA NITROGEN: 6 mg/dL — ABNORMAL LOW (ref 9–23)
BUN / CREAT RATIO: 8
CALCIUM: 9.2 mg/dL (ref 8.7–10.4)
CHLORIDE: 104 mmol/L (ref 98–107)
CO2: 30.4 mmol/L (ref 20.0–31.0)
CREATININE: 0.77 mg/dL
EGFR CKD-EPI (2021) MALE: >90 mL/min/{1.73_m2} (ref >=60)
GLUCOSE RANDOM: 72 mg/dL (ref 70–179)
POTASSIUM: 4 mmol/L (ref 3.4–4.8)
PROTEIN TOTAL: 6.5 g/dL (ref 5.7–8.2)
SODIUM: 139 mmol/L (ref 135–145)

## 2022-03-16 LAB — URINALYSIS WITH MICROSCOPY
BILIRUBIN UA: NEGATIVE
BLOOD UA: NEGATIVE
GLUCOSE UA: NEGATIVE
KETONES UA: NEGATIVE
NITRITE UA: NEGATIVE
PH UA: 5.5 (ref 5.0–9.0)
PROTEIN UA: NEGATIVE
RBC UA: 1 /HPF (ref <=3)
SPECIFIC GRAVITY UA: 1.006 (ref 1.003–1.030)
SQUAMOUS EPITHELIAL: <1 /HPF (ref 0–5)
UROBILINOGEN UA: <2
WBC UA: 25 /HPF — ABNORMAL HIGH (ref <=2)

## 2022-03-16 LAB — CBC W/ AUTO DIFF
BASOPHILS ABSOLUTE COUNT: 0 10*9/L (ref 0.0–0.1)
BASOPHILS RELATIVE PERCENT: 0.6 %
EOSINOPHILS ABSOLUTE COUNT: 0.1 10*9/L (ref 0.0–0.5)
EOSINOPHILS RELATIVE PERCENT: 2 %
HEMATOCRIT: 38.6 % — ABNORMAL LOW (ref 39.0–48.0)
HEMOGLOBIN: 13.2 g/dL (ref 12.9–16.5)
LYMPHOCYTES ABSOLUTE COUNT: 1.2 10*9/L (ref 1.1–3.6)
LYMPHOCYTES RELATIVE PERCENT: 18.3 %
MEAN CORPUSCULAR HEMOGLOBIN CONC: 34.1 g/dL (ref 32.0–36.0)
MEAN CORPUSCULAR HEMOGLOBIN: 31.6 pg (ref 25.9–32.4)
MEAN CORPUSCULAR VOLUME: 92.8 fL (ref 77.6–95.7)
MEAN PLATELET VOLUME: 6.8 fL (ref 6.8–10.7)
MONOCYTES ABSOLUTE COUNT: 0.6 10*9/L (ref 0.3–0.8)
MONOCYTES RELATIVE PERCENT: 9.9 %
NEUTROPHILS ABSOLUTE COUNT: 4.4 10*9/L (ref 1.8–7.8)
NEUTROPHILS RELATIVE PERCENT: 69.2 %
NUCLEATED RED BLOOD CELLS: 0 /100{WBCs} (ref <=4)
PLATELET COUNT: 300 10*9/L (ref 150–450)
RED BLOOD CELL COUNT: 4.16 10*12/L — ABNORMAL LOW (ref 4.26–5.60)
RED CELL DISTRIBUTION WIDTH: 14.1 % (ref 12.2–15.2)
WBC ADJUSTED: 6.3 10*9/L (ref 3.6–11.2)

## 2022-03-16 MED ADMIN — sodium chloride (NS) 0.9 % infusion: 100 mL/h | INTRAVENOUS | @ 19:00:00 | Stop: 2022-03-16

## 2022-03-16 MED ADMIN — bevacizumab (AVASTIN) 349.5 mg in sodium chloride (NS) 0.9 % 100 mL IVPB: 5 mg/kg | INTRAVENOUS | @ 19:00:00 | Stop: 2022-03-16

## 2022-03-16 NOTE — Unmapped (Signed)
VS taken. Labs drawn via newly place PIV when pt met with Provider in clinic.   Port flushed and brisk blood return noted.  No premed per plan.  IV fluids given.    Pt received Bevacizumab over 10 minutes- no adverse events noted.   AVS given.  Pt stable and ambulated independently from clinic accompanied by male family member.  No concerns voiced.  VWilliams,RN.

## 2022-03-16 NOTE — Unmapped (Signed)
Clinical Support on 03/16/2022   Component Date Value Ref Range Status    Color, UA 03/16/2022 Colorless   Final    Clarity, UA 03/16/2022 Clear   Final    Specific Gravity, UA 03/16/2022 1.006  1.003 - 1.030 Final    pH, UA 03/16/2022 5.5  5.0 - 9.0 Final    Leukocyte Esterase, UA 03/16/2022 Moderate (A)  Negative Final    Nitrite, UA 03/16/2022 Negative  Negative Final    Protein, UA 03/16/2022 Negative  Negative Final    Glucose, UA 03/16/2022 Negative  Negative Final    Ketones, UA 03/16/2022 Negative  Negative Final    Urobilinogen, UA 03/16/2022 <2.0 mg/dL  <1.6 mg/dL Final    Bilirubin, UA 03/16/2022 Negative  Negative Final    Blood, UA 03/16/2022 Negative  Negative Final    RBC, UA 03/16/2022 1  <=3 /HPF Final    WBC, UA 03/16/2022 25 (H)  <=2 /HPF Final    Squam Epithel, UA 03/16/2022 <1  0 - 5 /HPF Final    Bacteria, UA 03/16/2022 Few (A)  None Seen /HPF Final    Sodium 03/16/2022 139  135 - 145 mmol/L Final    Potassium 03/16/2022 4.0  3.4 - 4.8 mmol/L Final    Chloride 03/16/2022 104  98 - 107 mmol/L Final    CO2 03/16/2022 30.4  20.0 - 31.0 mmol/L Final    Anion Gap 03/16/2022 5  5 - 14 mmol/L Final    BUN 03/16/2022 6 (L)  9 - 23 mg/dL Final    Creatinine 10/96/0454 0.77  0.60 - 1.10 mg/dL Final    BUN/Creatinine Ratio 03/16/2022 8   Final    eGFR CKD-EPI (2021) Male 03/16/2022 >90  >=60 mL/min/1.103m2 Final    eGFR calculated with CKD-EPI 2021 equation in accordance with [employer] and [employer] of Nephrology Task Force recommendations.    Glucose 03/16/2022 72  70 - 179 mg/dL Final    Calcium 09/81/1914 9.2  8.7 - 10.4 mg/dL Final    Albumin 78/29/5621 3.5  3.4 - 5.0 g/dL Final    Total Protein 03/16/2022 6.5  5.7 - 8.2 g/dL Final    Total Bilirubin 03/16/2022 0.4  0.3 - 1.2 mg/dL Final    AST 30/86/5784 14  <=34 U/L Final    ALT 03/16/2022 7 (L)  10 - 49 U/L Final    Alkaline Phosphatase 03/16/2022 61  46 - 116 U/L Final    WBC 03/16/2022 6.3  3.6 - 11.2 10*9/L Final    RBC 03/16/2022 4.16 (L)  4.26 - 5.60 10*12/L Final    HGB 03/16/2022 13.2  12.9 - 16.5 g/dL Final    HCT 69/62/9528 38.6 (L)  39.0 - 48.0 % Final    MCV 03/16/2022 92.8  77.6 - 95.7 fL Final    MCH 03/16/2022 31.6  25.9 - 32.4 pg Final    MCHC 03/16/2022 34.1  32.0 - 36.0 g/dL Final    RDW 41/32/4401 14.1  12.2 - 15.2 % Final    MPV 03/16/2022 6.8  6.8 - 10.7 fL Final    Platelet 03/16/2022 300  150 - 450 10*9/L Final    nRBC 03/16/2022 0  <=4 /100 WBCs Final    Neutrophils % 03/16/2022 69.2  % Final    Lymphocytes % 03/16/2022 18.3  % Final    Monocytes % 03/16/2022 9.9  % Final    Eosinophils % 03/16/2022 2.0  % Final    Basophils % 03/16/2022  0.6  % Final    Absolute Neutrophils 03/16/2022 4.4  1.8 - 7.8 10*9/L Final    Absolute Lymphocytes 03/16/2022 1.2  1.1 - 3.6 10*9/L Final    Absolute Monocytes 03/16/2022 0.6  0.3 - 0.8 10*9/L Final    Absolute Eosinophils 03/16/2022 0.1  0.0 - 0.5 10*9/L Final    Absolute Basophils 03/16/2022 0.0  0.0 - 0.1 10*9/L Final
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
## 2022-03-30 ENCOUNTER — Ambulatory Visit: Admit: 2022-03-30 | Discharge: 2022-03-30 | Payer: MEDICAID

## 2022-03-30 ENCOUNTER — Institutional Professional Consult (permissible substitution): Admit: 2022-03-30 | Discharge: 2022-03-30 | Payer: MEDICAID

## 2022-03-30 DIAGNOSIS — C719 Malignant neoplasm of brain, unspecified: Principal | ICD-10-CM

## 2022-03-30 LAB — URINALYSIS WITH MICROSCOPY
BACTERIA: NONE SEEN /HPF
BILIRUBIN UA: NEGATIVE
BLOOD UA: NEGATIVE
GLUCOSE UA: NEGATIVE
KETONES UA: NEGATIVE
NITRITE UA: NEGATIVE
PH UA: 7 (ref 5.0–9.0)
PROTEIN UA: NEGATIVE
RBC UA: <1 /HPF (ref <=3)
SPECIFIC GRAVITY UA: 1.007 (ref 1.003–1.030)
SQUAMOUS EPITHELIAL: <1 /HPF (ref 0–5)
UROBILINOGEN UA: <2
WBC UA: 4 /HPF — ABNORMAL HIGH (ref <=2)

## 2022-03-30 LAB — CBC W/ AUTO DIFF
BASOPHILS ABSOLUTE COUNT: 0.1 10*9/L (ref 0.0–0.1)
BASOPHILS RELATIVE PERCENT: 0.9 %
EOSINOPHILS ABSOLUTE COUNT: 0.2 10*9/L (ref 0.0–0.5)
EOSINOPHILS RELATIVE PERCENT: 2.9 %
HEMATOCRIT: 40.1 % (ref 39.0–48.0)
HEMOGLOBIN: 13.8 g/dL (ref 12.9–16.5)
LYMPHOCYTES ABSOLUTE COUNT: 1.1 10*9/L (ref 1.1–3.6)
LYMPHOCYTES RELATIVE PERCENT: 15.3 %
MEAN CORPUSCULAR HEMOGLOBIN CONC: 34.5 g/dL (ref 32.0–36.0)
MEAN CORPUSCULAR HEMOGLOBIN: 31.6 pg (ref 25.9–32.4)
MEAN CORPUSCULAR VOLUME: 91.8 fL (ref 77.6–95.7)
MEAN PLATELET VOLUME: 7.2 fL (ref 6.8–10.7)
MONOCYTES ABSOLUTE COUNT: 0.6 10*9/L (ref 0.3–0.8)
MONOCYTES RELATIVE PERCENT: 8.3 %
NEUTROPHILS ABSOLUTE COUNT: 5.1 10*9/L (ref 1.8–7.8)
NEUTROPHILS RELATIVE PERCENT: 72.6 %
NUCLEATED RED BLOOD CELLS: 0 /100{WBCs} (ref <=4)
PLATELET COUNT: 392 10*9/L (ref 150–450)
RED BLOOD CELL COUNT: 4.37 10*12/L (ref 4.26–5.60)
RED CELL DISTRIBUTION WIDTH: 13.9 % (ref 12.2–15.2)
WBC ADJUSTED: 7 10*9/L (ref 3.6–11.2)

## 2022-03-30 LAB — BASIC METABOLIC PANEL
ANION GAP: 7 mmol/L (ref 5–14)
BLOOD UREA NITROGEN: 5 mg/dL — ABNORMAL LOW (ref 9–23)
BUN / CREAT RATIO: 6
CALCIUM: 9.6 mg/dL (ref 8.7–10.4)
CHLORIDE: 104 mmol/L (ref 98–107)
CO2: 28.4 mmol/L (ref 20.0–31.0)
CREATININE: 0.77 mg/dL
EGFR CKD-EPI (2021) MALE: >90 mL/min/{1.73_m2} (ref >=60)
GLUCOSE RANDOM: 83 mg/dL (ref 70–179)
POTASSIUM: 4 mmol/L (ref 3.4–4.8)
SODIUM: 139 mmol/L (ref 135–145)

## 2022-03-30 MED ADMIN — bevacizumab (AVASTIN) 349.5 mg in sodium chloride (NS) 0.9 % 100 mL IVPB: 5 mg/kg | INTRAVENOUS | @ 18:00:00 | Stop: 2022-03-30

## 2022-03-30 MED ADMIN — sodium chloride (NS) 0.9 % infusion: 100 mL/h | INTRAVENOUS | @ 18:00:00 | Stop: 2022-03-30

## 2022-03-30 NOTE — Unmapped (Signed)
Pt arrived to the infusion clinic at 1330. Weight and vitals were obtained. PIV was placed during the clinic visit, dsg clean, dry and intact. RN flushed with blood return, labs obtained and resulted within treatment parameters. No premeds per treatment plan. Pt was administered chemotherapy regimen as ordered without complications while in clinic. PIV removed and covered with 2x2 gauze and bandaid. Site around PIV clean, dry, and intact. Pt given after visit summary and discharged home for self care.

## 2022-03-30 NOTE — Unmapped (Signed)
Clinical Support on 03/30/2022   Component Date Value Ref Range Status    Sodium 03/30/2022 139  135 - 145 mmol/L Final    Potassium 03/30/2022 4.0  3.4 - 4.8 mmol/L Final    Chloride 03/30/2022 104  98 - 107 mmol/L Final    CO2 03/30/2022 28.4  20.0 - 31.0 mmol/L Final    Anion Gap 03/30/2022 7  5 - 14 mmol/L Final    BUN 03/30/2022 5 (L)  9 - 23 mg/dL Final    Creatinine 42/59/5638 0.77  0.60 - 1.10 mg/dL Final    BUN/Creatinine Ratio 03/30/2022 6   Final    eGFR CKD-EPI (2021) Male 03/30/2022 >90  >=60 mL/min/1.39m2 Final    eGFR calculated with CKD-EPI 2021 equation in accordance with [employer] and [employer] of Nephrology Task Force recommendations.    Glucose 03/30/2022 83  70 - 179 mg/dL Final    Calcium 75/64/3329 9.6  8.7 - 10.4 mg/dL Final    WBC 51/88/4166 7.0  3.6 - 11.2 10*9/L Final    RBC 03/30/2022 4.37  4.26 - 5.60 10*12/L Final    HGB 03/30/2022 13.8  12.9 - 16.5 g/dL Final    HCT 02/14/1600 40.1  39.0 - 48.0 % Final    MCV 03/30/2022 91.8  77.6 - 95.7 fL Final    MCH 03/30/2022 31.6  25.9 - 32.4 pg Final    MCHC 03/30/2022 34.5  32.0 - 36.0 g/dL Final    RDW 09/32/3557 13.9  12.2 - 15.2 % Final    MPV 03/30/2022 7.2  6.8 - 10.7 fL Final    Platelet 03/30/2022 392  150 - 450 10*9/L Final    nRBC 03/30/2022 0  <=4 /100 WBCs Final    Neutrophils % 03/30/2022 72.6  % Final    Lymphocytes % 03/30/2022 15.3  % Final    Monocytes % 03/30/2022 8.3  % Final    Eosinophils % 03/30/2022 2.9  % Final    Basophils % 03/30/2022 0.9  % Final    Absolute Neutrophils 03/30/2022 5.1  1.8 - 7.8 10*9/L Final    Absolute Lymphocytes 03/30/2022 1.1  1.1 - 3.6 10*9/L Final    Absolute Monocytes 03/30/2022 0.6  0.3 - 0.8 10*9/L Final    Absolute Eosinophils 03/30/2022 0.2  0.0 - 0.5 10*9/L Final    Absolute Basophils 03/30/2022 0.1  0.0 - 0.1 10*9/L Final    Color, UA 03/30/2022 Colorless   Final    Clarity, UA 03/30/2022 Clear   Final    Specific Gravity, UA 03/30/2022 1.007  1.003 - 1.030 Final pH, UA 03/30/2022 7.0  5.0 - 9.0 Final    Leukocyte Esterase, UA 03/30/2022 Trace (A)  Negative Final    Nitrite, UA 03/30/2022 Negative  Negative Final    Protein, UA 03/30/2022 Negative  Negative Final    Glucose, UA 03/30/2022 Negative  Negative Final    Ketones, UA 03/30/2022 Negative  Negative Final    Urobilinogen, UA 03/30/2022 <2.0 mg/dL  <3.2 mg/dL Final    Bilirubin, UA 03/30/2022 Negative  Negative Final    Blood, UA 03/30/2022 Negative  Negative Final    RBC, UA 03/30/2022 <1  <=3 /HPF Final    WBC, UA 03/30/2022 4 (H)  <=2 /HPF Final    Squam Epithel, UA 03/30/2022 <1  0 - 5 /HPF Final    Bacteria, UA 03/30/2022 None Seen  None Seen /HPF Final    Mucus, UA 03/30/2022 Rare (A)  None  Seen /HPF Final

## 2022-04-07 ENCOUNTER — Ambulatory Visit: Admit: 2022-04-07 | Discharge: 2022-04-07

## 2022-04-07 ENCOUNTER — Other Ambulatory Visit: Admit: 2022-04-07 | Discharge: 2022-04-07

## 2022-04-07 DIAGNOSIS — C719 Malignant neoplasm of brain, unspecified: Principal | ICD-10-CM

## 2022-04-07 LAB — COMPREHENSIVE METABOLIC PANEL
ALBUMIN: 3.9 g/dL (ref 3.4–5.0)
ALKALINE PHOSPHATASE: 68 U/L (ref 46–116)
ALT (SGPT): 12 U/L (ref 10–49)
ANION GAP: 6 mmol/L (ref 5–14)
AST (SGOT): 16 U/L (ref <=34)
BILIRUBIN TOTAL: 0.4 mg/dL (ref 0.3–1.2)
BLOOD UREA NITROGEN: <5 mg/dL — ABNORMAL LOW (ref 9–23)
CALCIUM: 9.3 mg/dL (ref 8.7–10.4)
CHLORIDE: 106 mmol/L (ref 98–107)
CO2: 28 mmol/L (ref 20.0–31.0)
CREATININE: 0.77 mg/dL
EGFR CKD-EPI (2021) MALE: >90 mL/min/{1.73_m2} (ref >=60)
GLUCOSE RANDOM: 86 mg/dL (ref 70–179)
POTASSIUM: 4.1 mmol/L (ref 3.4–4.8)
PROTEIN TOTAL: 7 g/dL (ref 5.7–8.2)
SODIUM: 140 mmol/L (ref 135–145)

## 2022-04-07 LAB — CBC W/ AUTO DIFF
BASOPHILS ABSOLUTE COUNT: 0.1 10*9/L (ref 0.0–0.1)
BASOPHILS RELATIVE PERCENT: 1.9 %
EOSINOPHILS ABSOLUTE COUNT: 0.2 10*9/L (ref 0.0–0.5)
EOSINOPHILS RELATIVE PERCENT: 2.6 %
HEMATOCRIT: 40.3 % (ref 39.0–48.0)
HEMOGLOBIN: 13.8 g/dL (ref 12.9–16.5)
LYMPHOCYTES ABSOLUTE COUNT: 1.1 10*9/L (ref 1.1–3.6)
LYMPHOCYTES RELATIVE PERCENT: 16.8 %
MEAN CORPUSCULAR HEMOGLOBIN CONC: 34.3 g/dL (ref 32.0–36.0)
MEAN CORPUSCULAR HEMOGLOBIN: 31.3 pg (ref 25.9–32.4)
MEAN CORPUSCULAR VOLUME: 91.3 fL (ref 77.6–95.7)
MEAN PLATELET VOLUME: 7.5 fL (ref 6.8–10.7)
MONOCYTES ABSOLUTE COUNT: 0.5 10*9/L (ref 0.3–0.8)
MONOCYTES RELATIVE PERCENT: 7.1 %
NEUTROPHILS ABSOLUTE COUNT: 4.8 10*9/L (ref 1.8–7.8)
NEUTROPHILS RELATIVE PERCENT: 71.6 %
PLATELET COUNT: 343 10*9/L (ref 150–450)
RED BLOOD CELL COUNT: 4.41 10*12/L (ref 4.26–5.60)
RED CELL DISTRIBUTION WIDTH: 14 % (ref 12.2–15.2)
WBC ADJUSTED: 6.8 10*9/L (ref 3.6–11.2)

## 2022-04-07 MED ADMIN — gadobenate dimeglumine (MULTIHANCE) 529 mg/mL (0.1mmol/0.2mL) solution 14 mL: 14 mL | INTRAVENOUS | @ 15:00:00 | Stop: 2022-04-07
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
## 2022-04-08 NOTE — Unmapped (Signed)
Specialty Medication(s): Temozolomide    Mr.Forinash has been dis-enrolled from the [facility] specialty pharmacy services due to  Medication discontinued and patient is treated with bevzcizumab .    Additional information provided to the patient: NA    Jonea Bukowski A [facility] Specialty Pharmacist

## 2022-04-13 ENCOUNTER — Ambulatory Visit: Admit: 2022-04-13 | Discharge: 2022-04-13 | Payer: MEDICAID

## 2022-04-13 ENCOUNTER — Institutional Professional Consult (permissible substitution): Admit: 2022-04-13 | Discharge: 2022-04-13 | Payer: MEDICAID

## 2022-04-13 DIAGNOSIS — D496 Neoplasm of unspecified behavior of brain: Principal | ICD-10-CM

## 2022-04-13 DIAGNOSIS — C719 Malignant neoplasm of brain, unspecified: Principal | ICD-10-CM

## 2022-04-27 ENCOUNTER — Institutional Professional Consult (permissible substitution): Admit: 2022-04-27 | Discharge: 2022-04-27 | Payer: MEDICAID

## 2022-04-27 ENCOUNTER — Ambulatory Visit: Admit: 2022-04-27 | Discharge: 2022-04-27 | Payer: MEDICAID

## 2022-04-27 DIAGNOSIS — C719 Malignant neoplasm of brain, unspecified: Principal | ICD-10-CM

## 2022-05-05 DIAGNOSIS — C719 Malignant neoplasm of brain, unspecified: Principal | ICD-10-CM

## 2022-05-11 ENCOUNTER — Ambulatory Visit: Admit: 2022-05-11 | Discharge: 2022-05-12 | Payer: MEDICAID

## 2022-05-11 ENCOUNTER — Institutional Professional Consult (permissible substitution): Admit: 2022-05-11 | Discharge: 2022-05-12 | Payer: MEDICAID

## 2022-05-11 DIAGNOSIS — C719 Malignant neoplasm of brain, unspecified: Principal | ICD-10-CM

## 2022-05-25 ENCOUNTER — Ambulatory Visit: Admit: 2022-05-25 | Discharge: 2022-05-26 | Payer: MEDICAID

## 2022-05-25 ENCOUNTER — Institutional Professional Consult (permissible substitution): Admit: 2022-05-25 | Discharge: 2022-05-26 | Payer: MEDICAID

## 2022-05-25 DIAGNOSIS — C719 Malignant neoplasm of brain, unspecified: Principal | ICD-10-CM

## 2022-06-08 ENCOUNTER — Institutional Professional Consult (permissible substitution): Admit: 2022-06-08 | Discharge: 2022-06-08 | Payer: MEDICAID

## 2022-06-08 ENCOUNTER — Ambulatory Visit: Admit: 2022-06-08 | Discharge: 2022-06-08 | Payer: MEDICAID

## 2022-06-08 DIAGNOSIS — C719 Malignant neoplasm of brain, unspecified: Principal | ICD-10-CM

## 2022-06-15 ENCOUNTER — Ambulatory Visit: Admit: 2022-06-15 | Discharge: 2022-06-16 | Payer: MEDICAID

## 2022-06-15 DIAGNOSIS — C719 Malignant neoplasm of brain, unspecified: Principal | ICD-10-CM

## 2022-06-15 MED ORDER — LISINOPRIL 10 MG TABLET
ORAL_TABLET | Freq: Every day | ORAL | 11 refills | 30 days | Status: CP
Start: 2022-06-15 — End: 2022-06-15

## 2022-06-19 DIAGNOSIS — C719 Malignant neoplasm of brain, unspecified: Principal | ICD-10-CM

## 2022-07-14 ENCOUNTER — Ambulatory Visit: Admit: 2022-07-14 | Discharge: 2022-07-15 | Payer: PRIVATE HEALTH INSURANCE

## 2022-07-21 ENCOUNTER — Ambulatory Visit: Admit: 2022-07-21 | Discharge: 2022-07-22

## 2022-07-21 ENCOUNTER — Ambulatory Visit: Admit: 2022-07-21 | Discharge: 2022-07-22 | Attending: Radiation Oncology | Primary: Radiation Oncology

## 2022-07-21 DIAGNOSIS — M5430 Sciatica, unspecified side: Principal | ICD-10-CM

## 2022-07-21 DIAGNOSIS — R32 Unspecified urinary incontinence: Principal | ICD-10-CM

## 2022-07-21 DIAGNOSIS — C719 Malignant neoplasm of brain, unspecified: Principal | ICD-10-CM

## 2022-08-04 ENCOUNTER — Ambulatory Visit: Admit: 2022-08-04 | Discharge: 2022-08-05 | Payer: MEDICAID

## 2022-08-21 ENCOUNTER — Ambulatory Visit: Admit: 2022-08-21 | Discharge: 2022-08-28 | Payer: MEDICAID

## 2022-08-21 ENCOUNTER — Encounter: Admit: 2022-08-21 | Payer: MEDICAID | Attending: Student in an Organized Health Care Education/Training Program

## 2022-08-21 ENCOUNTER — Ambulatory Visit: Admit: 2022-08-21 | Discharge: 2022-08-28 | Disposition: A | Discharge status: Rehabilitation Facility | Payer: MEDICAID

## 2022-08-21 ENCOUNTER — Encounter: Admit: 2022-08-21 | Payer: MEDICAID

## 2022-08-28 ENCOUNTER — Ambulatory Visit
Admission: TF | Admit: 2022-08-28 | Discharge: 2022-09-04 | Disposition: A | Discharge status: Home / Self Care | Payer: MEDICAID | Source: Intra-hospital | Admitting: Student in an Organized Health Care Education/Training Program

## 2022-09-02 MED ORDER — FAMOTIDINE 20 MG TABLET
ORAL_TABLET | Freq: Two times a day (BID) | ORAL | 0 refills | 11 days | Status: CP
Start: 2022-09-02 — End: 2022-09-13
  Filled 2022-09-04: qty 22, 11d supply, fill #0

## 2022-09-02 MED ORDER — METHOCARBAMOL 500 MG TABLET
ORAL_TABLET | Freq: Three times a day (TID) | ORAL | 0 refills | 30 days | Status: CP
Start: 2022-09-02 — End: 2022-10-02
  Filled 2022-09-04: qty 90, 30d supply, fill #0

## 2022-09-02 MED ORDER — ACETAMINOPHEN 325 MG TABLET
ORAL_TABLET | ORAL | 0 refills | 8 days | Status: CP | PRN
Start: 2022-09-02 — End: ?
  Filled 2022-09-04: qty 90, 8d supply, fill #0

## 2022-09-02 MED ORDER — LISINOPRIL 10 MG TABLET
ORAL_TABLET | Freq: Every day | ORAL | 0 refills | 30 days | Status: CP
Start: 2022-09-02 — End: 2022-10-02

## 2022-09-02 MED ORDER — NICOTINE 14 MG/24 HR DAILY TRANSDERMAL PATCH
MEDICATED_PATCH | Freq: Every day | TRANSDERMAL | 0 refills | 28 days | Status: CP
Start: 2022-09-02 — End: ?
  Filled 2022-09-04: qty 28, 28d supply, fill #0

## 2022-09-03 MED ORDER — PANTOPRAZOLE 40 MG TABLET,DELAYED RELEASE
ORAL_TABLET | Freq: Every day | ORAL | 0 refills | 30 days | Status: CP
Start: 2022-09-03 — End: 2022-10-03
  Filled 2022-09-04: qty 30, 30d supply, fill #0

## 2022-09-03 MED ORDER — DEXAMETHASONE 2 MG TABLET
ORAL_TABLET | Freq: Three times a day (TID) | ORAL | 0 refills | 4 days | Status: CP
Start: 2022-09-03 — End: 2022-09-08

## 2022-09-04 MED ORDER — LEVETIRACETAM 750 MG TABLET
ORAL_TABLET | Freq: Two times a day (BID) | ORAL | 0 refills | 30 days | Status: CP
Start: 2022-09-04 — End: 2022-10-04
  Filled 2022-09-04: qty 60, 30d supply, fill #0

## 2022-09-04 MED ORDER — LISINOPRIL 10 MG TABLET
ORAL_TABLET | Freq: Every day | ORAL | 0 refills | 30 days | Status: CP
Start: 2022-09-04 — End: 2022-10-04
  Filled 2022-09-04: qty 60, 30d supply, fill #0

## 2022-09-05 MED ORDER — DEXAMETHASONE 2 MG TABLET
ORAL_TABLET | Freq: Two times a day (BID) | ORAL | 0 refills | 2 days | Status: CP
Start: 2022-09-05 — End: 2022-09-02
  Filled 2022-09-04: qty 12, 6d supply, fill #0

## 2022-09-07 DIAGNOSIS — C719 Malignant neoplasm of brain, unspecified: Principal | ICD-10-CM

## 2022-09-08 ENCOUNTER — Ambulatory Visit: Admit: 2022-09-08 | Discharge: 2022-09-09 | Payer: MEDICAID

## 2022-09-08 DIAGNOSIS — D496 Neoplasm of unspecified behavior of brain: Principal | ICD-10-CM

## 2022-09-22 ENCOUNTER — Institutional Professional Consult (permissible substitution): Admit: 2022-09-22 | Discharge: 2022-09-23 | Payer: MEDICAID

## 2022-09-28 ENCOUNTER — Other Ambulatory Visit: Admit: 2022-09-28 | Discharge: 2022-09-28 | Payer: PRIVATE HEALTH INSURANCE

## 2022-09-28 ENCOUNTER — Ambulatory Visit: Admit: 2022-09-28 | Discharge: 2022-09-28 | Payer: MEDICAID

## 2022-09-28 DIAGNOSIS — C719 Malignant neoplasm of brain, unspecified: Principal | ICD-10-CM

## 2022-09-28 MED ORDER — LOMUSTINE 100 MG CAPSULE
ORAL_CAPSULE | Freq: Once | ORAL | 0 refills | 1 days | Status: CP
Start: 2022-09-28 — End: 2022-09-28

## 2022-09-28 MED ORDER — ZZ IMS TEMPLATE
Freq: Once | ORAL | 0 refills | 1 days | Status: CN
Start: 2022-09-28 — End: 2022-09-28

## 2022-09-28 MED ORDER — LOMUSTINE 10 MG CAPSULE
ORAL_CAPSULE | Freq: Once | ORAL | 0 refills | 1 days | Status: CP
Start: 2022-09-28 — End: 2022-09-28

## 2022-10-01 DIAGNOSIS — C719 Malignant neoplasm of brain, unspecified: Principal | ICD-10-CM

## 2022-10-08 DIAGNOSIS — C719 Malignant neoplasm of brain, unspecified: Principal | ICD-10-CM

## 2022-11-09 ENCOUNTER — Ambulatory Visit: Admit: 2022-11-09 | Discharge: 2022-11-09 | Payer: PRIVATE HEALTH INSURANCE

## 2022-11-09 ENCOUNTER — Other Ambulatory Visit: Admit: 2022-11-09 | Discharge: 2022-11-09 | Payer: PRIVATE HEALTH INSURANCE

## 2022-11-09 DIAGNOSIS — C719 Malignant neoplasm of brain, unspecified: Principal | ICD-10-CM

## 2022-11-09 DIAGNOSIS — D496 Neoplasm of unspecified behavior of brain: Principal | ICD-10-CM

## 2022-11-13 MED ORDER — LEVETIRACETAM 750 MG TABLET
ORAL_TABLET | ORAL | 0 refills | 0 days
Start: 2022-11-13 — End: ?

## 2022-11-16 MED ORDER — LEVETIRACETAM 750 MG TABLET
ORAL_TABLET | ORAL | 0 refills | 0 days
Start: 2022-11-16 — End: ?

## 2022-12-14 ENCOUNTER — Ambulatory Visit: Admit: 2022-12-14 | Discharge: 2022-12-14 | Payer: PRIVATE HEALTH INSURANCE

## 2022-12-14 DIAGNOSIS — D496 Neoplasm of unspecified behavior of brain: Principal | ICD-10-CM

## 2022-12-14 MED ORDER — LEVETIRACETAM 750 MG TABLET
ORAL_TABLET | Freq: Two times a day (BID) | ORAL | 12 refills | 30 days | Status: CP
Start: 2022-12-14 — End: 2024-01-08

## 2022-12-14 MED ORDER — LOMUSTINE 100 MG CAPSULE
ORAL_CAPSULE | Freq: Once | ORAL | 0 refills | 1 days | Status: CP
Start: 2022-12-14 — End: 2022-12-14

## 2022-12-14 MED ORDER — ONDANSETRON HCL 8 MG TABLET
ORAL_TABLET | 2 refills | 0 days | Status: CP
Start: 2022-12-14 — End: ?

## 2022-12-14 MED ORDER — ZZ IMS TEMPLATE
Freq: Once | ORAL | 0 refills | 1 days | Status: CN
Start: 2022-12-14 — End: 2022-12-14

## 2022-12-14 MED ORDER — LOMUSTINE 10 MG CAPSULE
ORAL_CAPSULE | Freq: Once | ORAL | 0 refills | 1 days | Status: CP
Start: 2022-12-14 — End: 2022-12-14

## 2022-12-16 DIAGNOSIS — D496 Neoplasm of unspecified behavior of brain: Principal | ICD-10-CM

## 2022-12-16 DIAGNOSIS — C719 Malignant neoplasm of brain, unspecified: Principal | ICD-10-CM

## 2022-12-16 MED ORDER — LOMUSTINE 100 MG CAPSULE
ORAL_CAPSULE | Freq: Once | ORAL | 0 refills | 1.00000 days | Status: CP
Start: 2022-12-16 — End: 2022-12-16

## 2022-12-16 MED ORDER — ZZ IMS TEMPLATE
Freq: Once | ORAL | 0 refills | 0.00000 days | Status: CN
Start: 2022-12-16 — End: 2022-12-16

## 2022-12-16 MED ORDER — LOMUSTINE 10 MG CAPSULE
ORAL_CAPSULE | Freq: Once | ORAL | 0 refills | 1.00000 days | Status: CP
Start: 2022-12-16 — End: 2022-12-16

## 2023-03-22 ENCOUNTER — Other Ambulatory Visit: Admit: 2023-03-22 | Discharge: 2023-03-22 | Payer: PRIVATE HEALTH INSURANCE

## 2023-03-22 ENCOUNTER — Ambulatory Visit: Admit: 2023-03-22 | Discharge: 2023-03-22 | Payer: PRIVATE HEALTH INSURANCE

## 2023-03-22 DIAGNOSIS — D496 Neoplasm of unspecified behavior of brain: Principal | ICD-10-CM

## 2023-03-22 DIAGNOSIS — C719 Malignant neoplasm of brain, unspecified: Principal | ICD-10-CM

## 2023-03-22 MED ORDER — ONDANSETRON HCL 8 MG TABLET
ORAL_TABLET | 2 refills | 0 days | Status: CP
Start: 2023-03-22 — End: ?

## 2023-03-22 MED ORDER — LOMUSTINE 10 MG CAPSULE
ORAL_CAPSULE | Freq: Once | ORAL | 0 refills | 1 days | Status: CP
Start: 2023-03-22 — End: 2023-03-22

## 2023-03-22 MED ORDER — ZZ IMS TEMPLATE
Freq: Once | ORAL | 0 refills | 1 days | Status: CN
Start: 2023-03-22 — End: 2023-03-22

## 2023-03-22 MED ORDER — LOMUSTINE 100 MG CAPSULE
ORAL_CAPSULE | Freq: Once | ORAL | 0 refills | 1 days | Status: CP
Start: 2023-03-22 — End: 2023-03-22

## 2023-04-27 ENCOUNTER — Other Ambulatory Visit: Admit: 2023-04-27 | Discharge: 2023-04-27 | Payer: PRIVATE HEALTH INSURANCE

## 2023-04-27 ENCOUNTER — Ambulatory Visit: Admit: 2023-04-27 | Discharge: 2023-04-27 | Payer: PRIVATE HEALTH INSURANCE

## 2023-04-27 DIAGNOSIS — D496 Neoplasm of unspecified behavior of brain: Principal | ICD-10-CM

## 2023-04-27 DIAGNOSIS — C719 Malignant neoplasm of brain, unspecified: Principal | ICD-10-CM

## 2023-04-27 MED ORDER — POTASSIUM CHLORIDE ER 20 MEQ TABLET,EXTENDED RELEASE(PART/CRYST)
ORAL_TABLET | Freq: Every day | ORAL | 0 refills | 30 days | Status: CP
Start: 2023-04-27 — End: ?

## 2023-05-03 ENCOUNTER — Ambulatory Visit: Admit: 2023-05-03 | Discharge: 2023-05-04 | Payer: PRIVATE HEALTH INSURANCE

## 2023-05-03 DIAGNOSIS — D496 Neoplasm of unspecified behavior of brain: Principal | ICD-10-CM

## 2023-05-03 MED ORDER — LOMUSTINE 10 MG CAPSULE
ORAL_CAPSULE | 0 refills | 0 days | Status: CP
Start: 2023-05-03 — End: ?

## 2023-05-03 MED ORDER — LOMUSTINE 100 MG CAPSULE
ORAL_CAPSULE | 0 refills | 0 days | Status: CP
Start: 2023-05-03 — End: ?

## 2023-06-14 ENCOUNTER — Other Ambulatory Visit: Admit: 2023-06-14 | Discharge: 2023-06-14 | Payer: PRIVATE HEALTH INSURANCE

## 2023-06-14 ENCOUNTER — Ambulatory Visit: Admit: 2023-06-14 | Discharge: 2023-06-14 | Payer: PRIVATE HEALTH INSURANCE

## 2023-06-14 DIAGNOSIS — C719 Malignant neoplasm of brain, unspecified: Principal | ICD-10-CM

## 2023-06-14 DIAGNOSIS — D496 Neoplasm of unspecified behavior of brain: Principal | ICD-10-CM

## 2023-06-16 DIAGNOSIS — C719 Malignant neoplasm of brain, unspecified: Principal | ICD-10-CM

## 2023-06-21 ENCOUNTER — Ambulatory Visit: Admit: 2023-06-21 | Discharge: 2023-06-22 | Payer: PRIVATE HEALTH INSURANCE

## 2023-06-22 DIAGNOSIS — C719 Malignant neoplasm of brain, unspecified: Principal | ICD-10-CM

## 2023-06-22 MED ORDER — LOMUSTINE 100 MG CAPSULE
ORAL_CAPSULE | Freq: Once | ORAL | 0 refills | 1.00000 days | Status: CP
Start: 2023-06-22 — End: 2023-06-22

## 2023-06-23 DIAGNOSIS — C719 Malignant neoplasm of brain, unspecified: Principal | ICD-10-CM

## 2023-06-23 MED ORDER — LOMUSTINE 100 MG CAPSULE
ORAL_CAPSULE | Freq: Once | ORAL | 0 refills | 1.00000 days | Status: CP
Start: 2023-06-23 — End: 2023-06-23

## 2023-07-12 MED ORDER — LISINOPRIL 10 MG TABLET
ORAL_TABLET | Freq: Every day | ORAL | 0 refills | 30 days | Status: CP
Start: 2023-07-12 — End: ?

## 2023-08-02 ENCOUNTER — Ambulatory Visit: Admit: 2023-08-02 | Discharge: 2023-08-02 | Payer: PRIVATE HEALTH INSURANCE

## 2023-08-03 ENCOUNTER — Ambulatory Visit: Admit: 2023-08-03 | Discharge: 2023-08-04 | Payer: PRIVATE HEALTH INSURANCE

## 2023-08-03 ENCOUNTER — Other Ambulatory Visit: Admit: 2023-08-03 | Discharge: 2023-08-04 | Payer: PRIVATE HEALTH INSURANCE

## 2023-08-03 DIAGNOSIS — D496 Neoplasm of unspecified behavior of brain: Principal | ICD-10-CM

## 2023-08-03 DIAGNOSIS — C719 Malignant neoplasm of brain, unspecified: Principal | ICD-10-CM

## 2023-08-03 MED ORDER — DEXAMETHASONE 1 MG TABLET
ORAL_TABLET | 0 refills | 0.00 days | Status: CP
Start: 2023-08-03 — End: ?

## 2023-08-04 DIAGNOSIS — C719 Malignant neoplasm of brain, unspecified: Principal | ICD-10-CM

## 2023-08-05 DIAGNOSIS — C719 Malignant neoplasm of brain, unspecified: Principal | ICD-10-CM

## 2023-08-06 DIAGNOSIS — C719 Malignant neoplasm of brain, unspecified: Principal | ICD-10-CM

## 2023-08-09 MED ORDER — LISINOPRIL 10 MG TABLET
ORAL_TABLET | Freq: Every day | ORAL | 0 refills | 0.00 days
Start: 2023-08-09 — End: ?

## 2023-08-13 DIAGNOSIS — C719 Malignant neoplasm of brain, unspecified: Principal | ICD-10-CM

## 2023-08-13 MED ORDER — LISINOPRIL 10 MG TABLET
ORAL_TABLET | Freq: Every day | ORAL | 0 refills | 30.00 days | Status: CP
Start: 2023-08-13 — End: ?

## 2023-09-18 DEATH — deceased
